# Patient Record
Sex: Female | Born: 1985 | Race: White | Hispanic: No | Marital: Married | Smoking: Current every day smoker
Health system: Southern US, Community
[De-identification: ages and names within clinical notes are randomized; demographics above are authoritative.]

## PROBLEM LIST (undated history)

## (undated) ENCOUNTER — Inpatient Hospital Stay: Payer: Self-pay

## (undated) DIAGNOSIS — Q318 Other congenital malformations of larynx: Secondary | ICD-10-CM

## (undated) DIAGNOSIS — J45909 Unspecified asthma, uncomplicated: Secondary | ICD-10-CM

## (undated) HISTORY — PX: OTHER SURGICAL HISTORY: SHX169

---

## 2010-08-07 ENCOUNTER — Observation Stay: Payer: Self-pay | Admitting: Obstetrics and Gynecology

## 2010-08-19 ENCOUNTER — Inpatient Hospital Stay: Payer: Self-pay | Admitting: Obstetrics and Gynecology

## 2010-10-01 ENCOUNTER — Observation Stay: Payer: Self-pay

## 2010-10-04 ENCOUNTER — Observation Stay: Payer: Self-pay | Admitting: Obstetrics and Gynecology

## 2010-10-19 ENCOUNTER — Inpatient Hospital Stay: Payer: Self-pay | Admitting: Obstetrics and Gynecology

## 2014-01-15 ENCOUNTER — Emergency Department: Payer: Self-pay | Admitting: Emergency Medicine

## 2014-01-22 ENCOUNTER — Emergency Department: Payer: Self-pay | Admitting: Emergency Medicine

## 2014-01-22 LAB — URINALYSIS, COMPLETE
Blood: NEGATIVE
Glucose,UR: NEGATIVE mg/dL (ref 0–75)
Nitrite: NEGATIVE
Ph: 5 (ref 4.5–8.0)
RBC,UR: 3 /HPF (ref 0–5)
Specific Gravity: 1.035 (ref 1.003–1.030)

## 2014-01-22 LAB — COMPREHENSIVE METABOLIC PANEL
ANION GAP: 8 (ref 7–16)
Albumin: 2.6 g/dL — ABNORMAL LOW (ref 3.4–5.0)
Alkaline Phosphatase: 70 U/L
BILIRUBIN TOTAL: 0.8 mg/dL (ref 0.2–1.0)
BUN: 19 mg/dL — ABNORMAL HIGH (ref 7–18)
CALCIUM: 7.6 mg/dL — AB (ref 8.5–10.1)
CHLORIDE: 93 mmol/L — AB (ref 98–107)
Co2: 30 mmol/L (ref 21–32)
Creatinine: 0.95 mg/dL (ref 0.60–1.30)
EGFR (African American): >60
EGFR (Non-African Amer.): >60
Glucose: 106 mg/dL — ABNORMAL HIGH (ref 65–99)
Osmolality: 265 (ref 275–301)
Potassium: 2.8 mmol/L — ABNORMAL LOW (ref 3.5–5.1)
SGOT(AST): 16 U/L (ref 15–37)
SGPT (ALT): 11 U/L — ABNORMAL LOW (ref 12–78)
Sodium: 131 mmol/L — ABNORMAL LOW (ref 136–145)
TOTAL PROTEIN: 6.4 g/dL (ref 6.4–8.2)

## 2014-01-22 LAB — CBC WITH DIFFERENTIAL/PLATELET
Basophil #: 0.1 10*3/uL (ref 0.0–0.1)
Basophil %: 0.3 %
EOS PCT: 0.4 %
Eosinophil #: 0.1 10*3/uL (ref 0.0–0.7)
HCT: 38 % (ref 35.0–47.0)
HGB: 12.8 g/dL (ref 12.0–16.0)
LYMPHS ABS: 1.5 10*3/uL (ref 1.0–3.6)
Lymphocyte %: 9.3 %
MCH: 31.7 pg (ref 26.0–34.0)
MCHC: 33.6 g/dL (ref 32.0–36.0)
MCV: 94 fL (ref 80–100)
Monocyte #: 1.3 x10 3/mm — ABNORMAL HIGH (ref 0.2–0.9)
Monocyte %: 8.6 %
NEUTROS PCT: 81.4 %
Neutrophil #: 12.7 10*3/uL — ABNORMAL HIGH (ref 1.4–6.5)
Platelet: 382 10*3/uL (ref 150–440)
RBC: 4.02 10*6/uL (ref 3.80–5.20)
RDW: 12.3 % (ref 11.5–14.5)
WBC: 15.6 10*3/uL — AB (ref 3.6–11.0)

## 2014-01-22 LAB — PREGNANCY, URINE: PREGNANCY TEST, URINE: NEGATIVE m[IU]/mL

## 2014-01-22 LAB — LIPASE, BLOOD: LIPASE: 145 U/L (ref 73–393)

## 2014-01-27 LAB — CULTURE, BLOOD (SINGLE)

## 2014-12-11 DIAGNOSIS — Z98891 History of uterine scar from previous surgery: Secondary | ICD-10-CM | POA: Insufficient documentation

## 2014-12-11 DIAGNOSIS — R6252 Short stature (child): Secondary | ICD-10-CM | POA: Insufficient documentation

## 2015-04-12 DIAGNOSIS — D509 Iron deficiency anemia, unspecified: Secondary | ICD-10-CM | POA: Insufficient documentation

## 2015-04-18 IMAGING — CR DG CHEST 2V
1 series · 2 of 2 positions shown · non-contrast
Comparison: 08/19/2010

CLINICAL DATA: Chest pain and short of breath

EXAM:
CHEST  2 VIEW

[Series 1: w chest pa · 0.14mm/px · 2 of 2 slices shown]
[im 1/2]
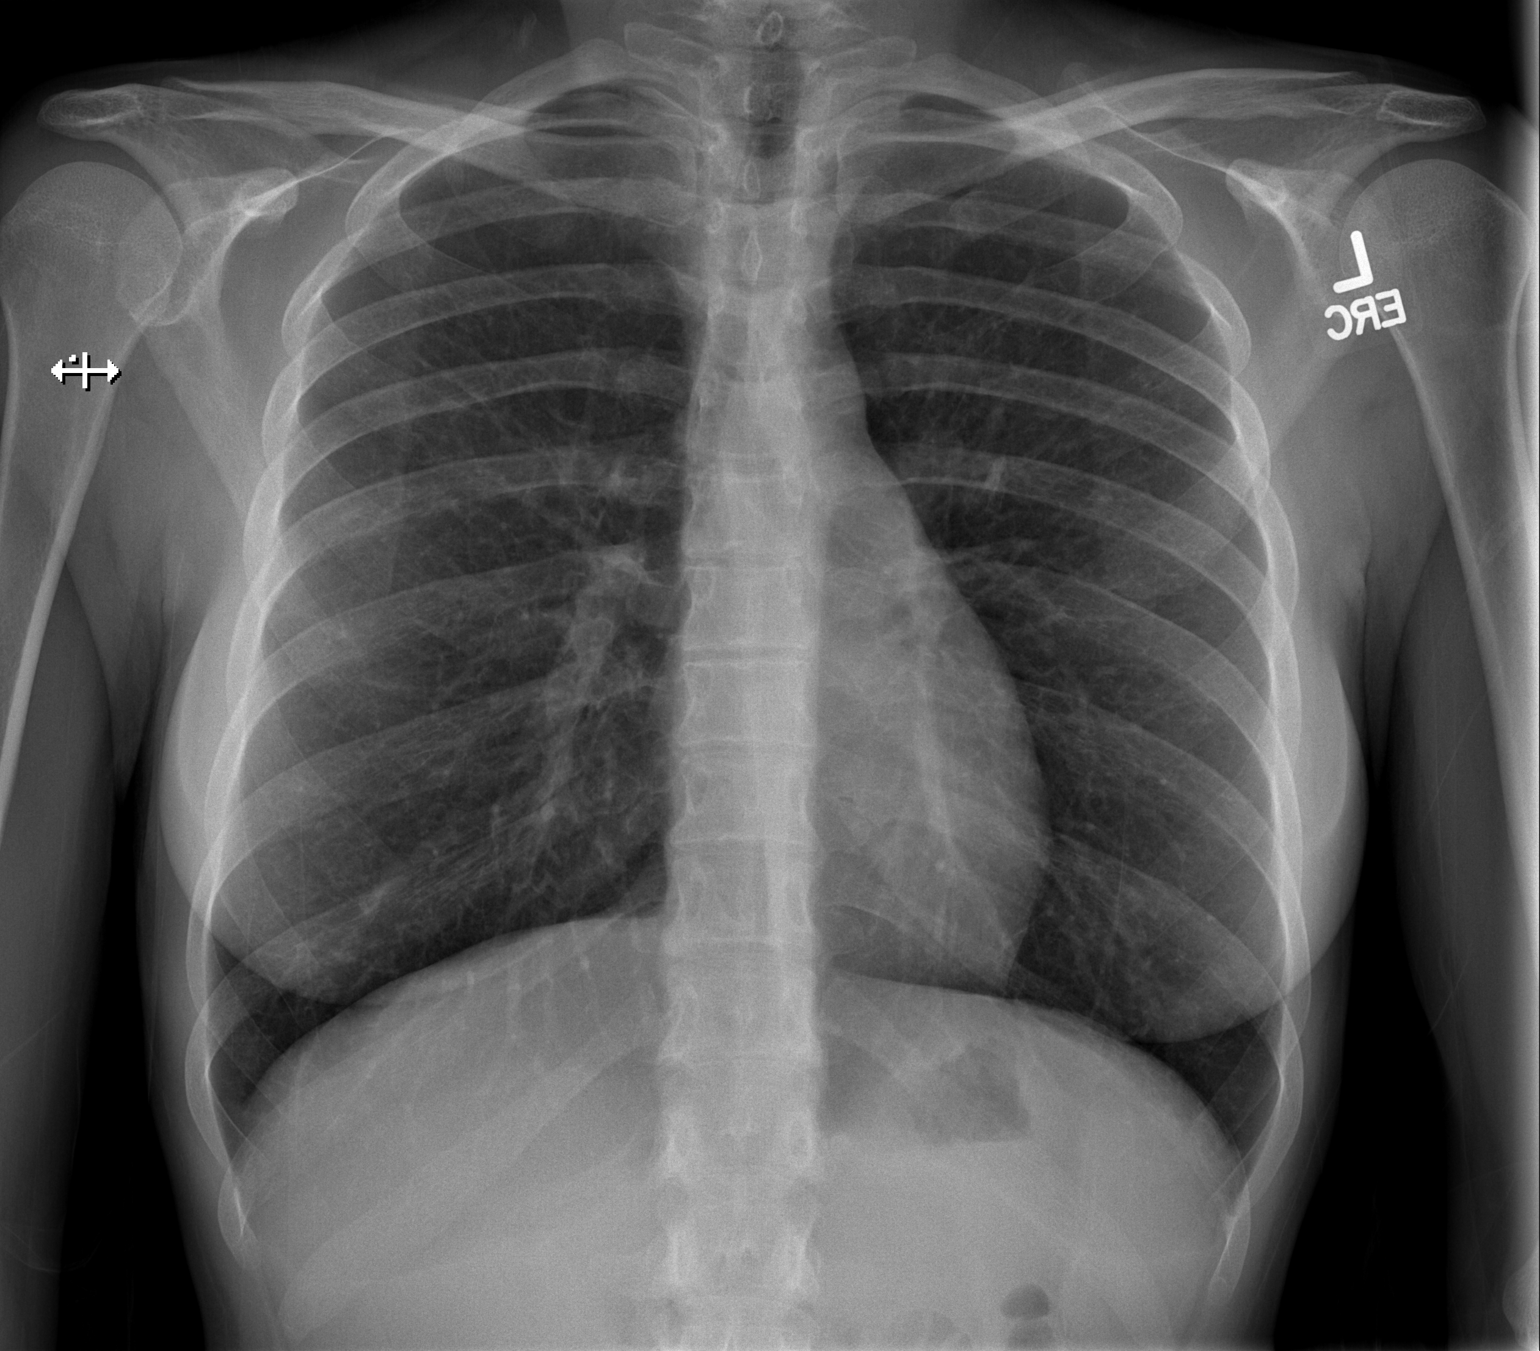
[im 2/2]
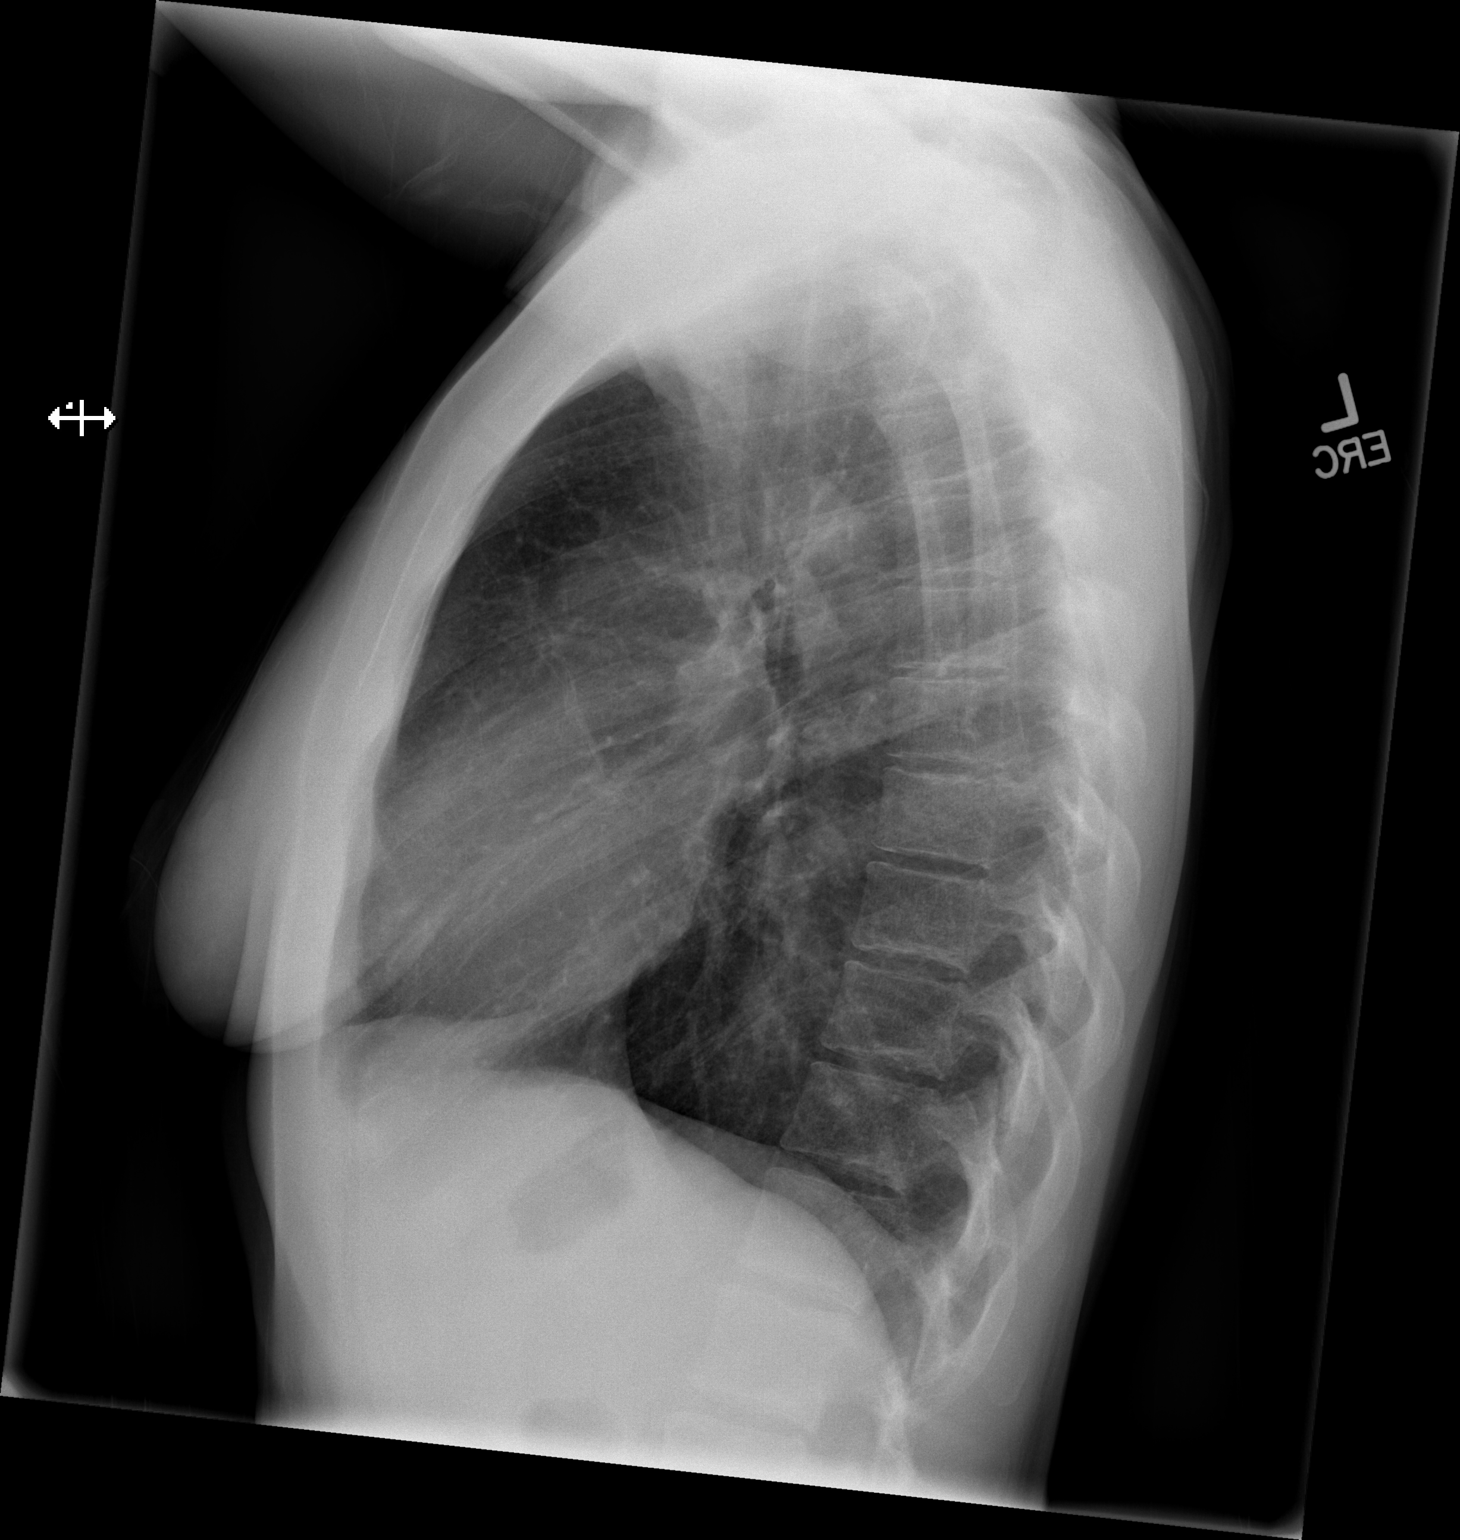

[2 of 2 positions shown; findings below may reference images not displayed]

FINDINGS: The heart size and mediastinal contours are within normal limits.
Both lungs are clear. The visualized skeletal structures are
unremarkable.
IMPRESSION: No active cardiopulmonary disease.

## 2015-04-18 IMAGING — CT CT MAXILLOFACIAL WITH CONTRAST
3 series · 16 of 47 positions shown, 19 images · IV contrast (agent unspecified)
Comparison: None.

CLINICAL DATA: Left facial swelling.  Recent  tooth extraction

EXAM:
CT MAXILLOFACIAL WITH CONTRAST
TECHNIQUE: Multidetector CT imaging of the maxillofacial structures was
performed with intravenous contrast. Multiplanar CT image
reconstructions were also generated. A small metallic BB was placed
on the right temple in order to reliably differentiate right from
left.
CONTRAST:  75 mL Ssovue-V00 IV

[Series 4: max soft 2 · axial · 0.34mm/px · z∈[-104,+16]mm · 10 of 70 slices shown, 13 images]
[im 5/70  brain]
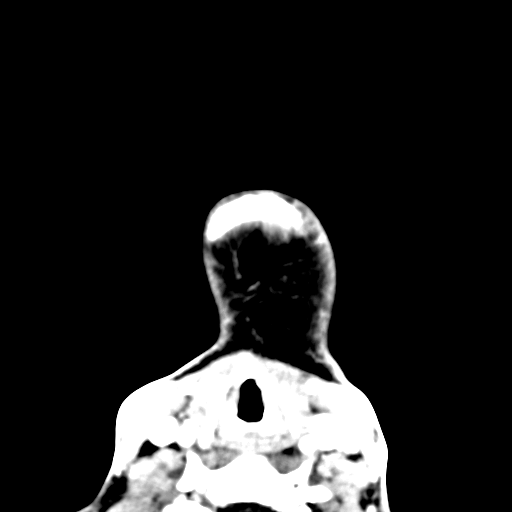
[im 5/70  bone]
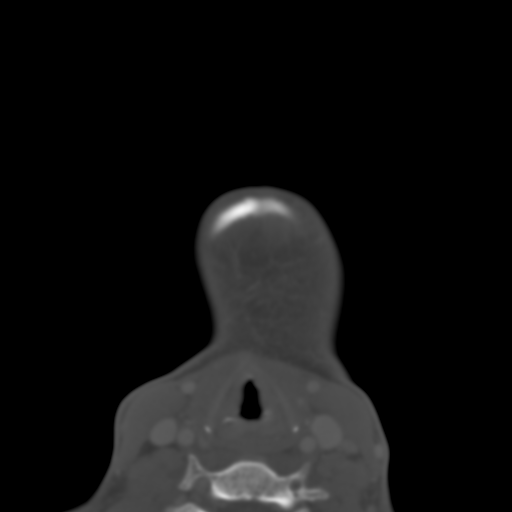
[im 12/70  bone]
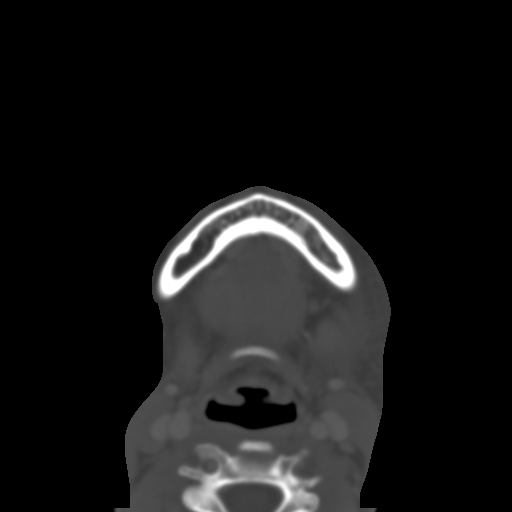
[im 20/70  bone]
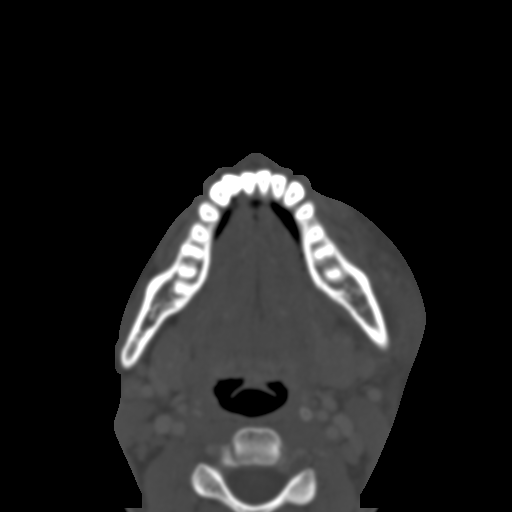
[im 24/70  bone]
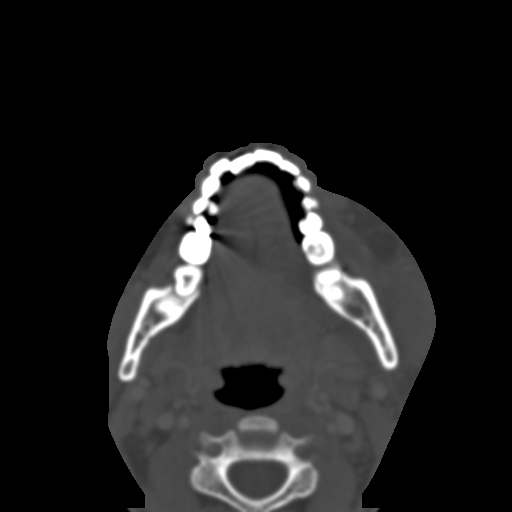
[im 31/70  brain]
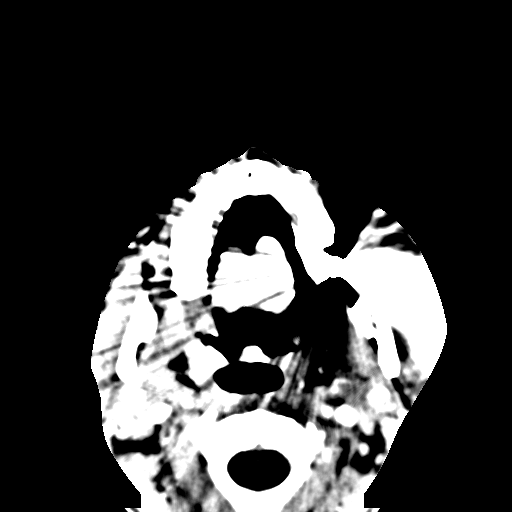
[im 31/70  bone]
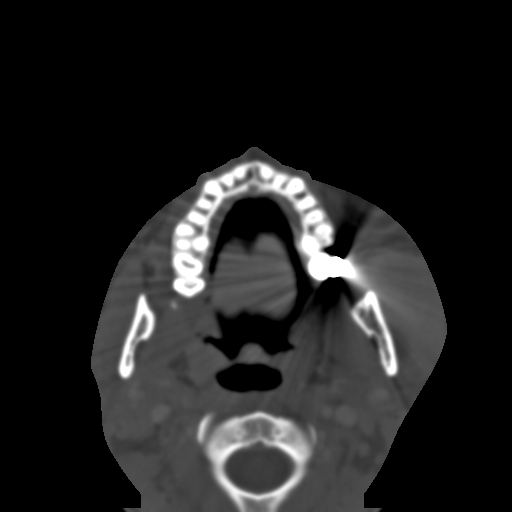
[im 39/70  bone]
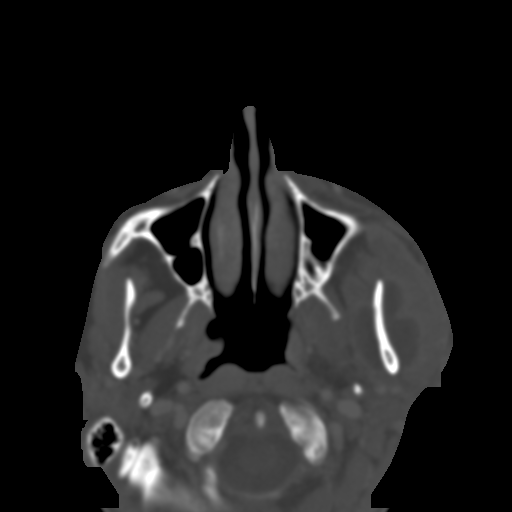
[im 46/70  bone]
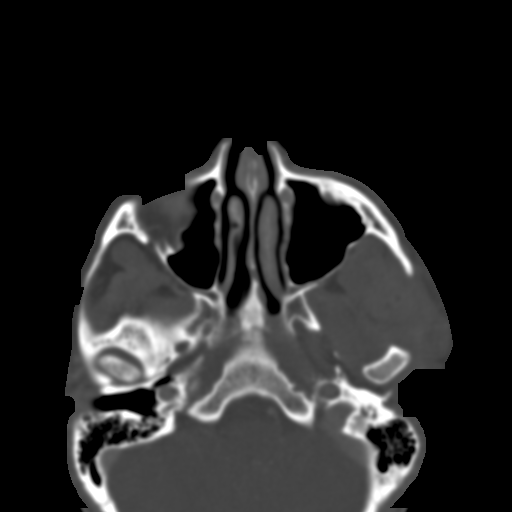
[im 53/70  bone]
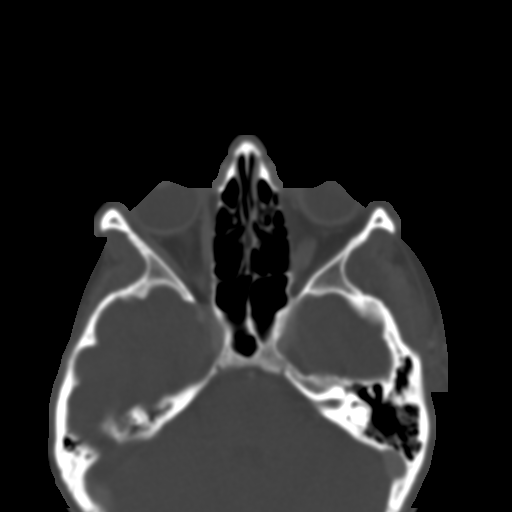
[im 58/70  brain]
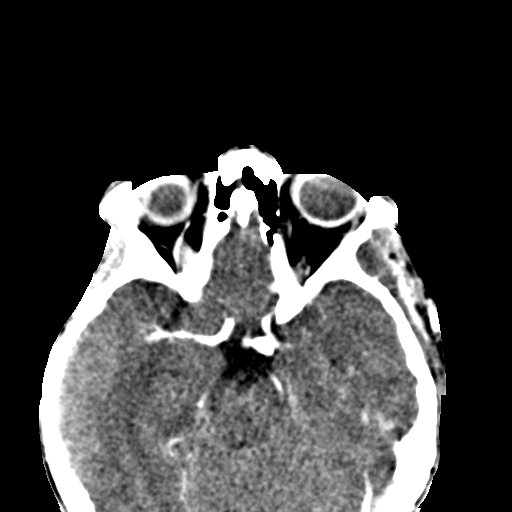
[im 58/70  bone]
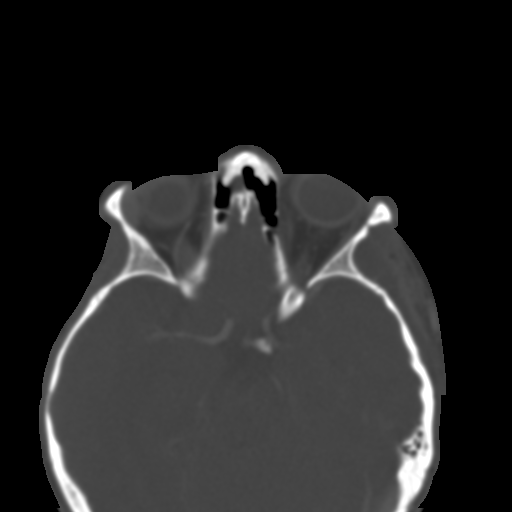
[im 65/70  bone]
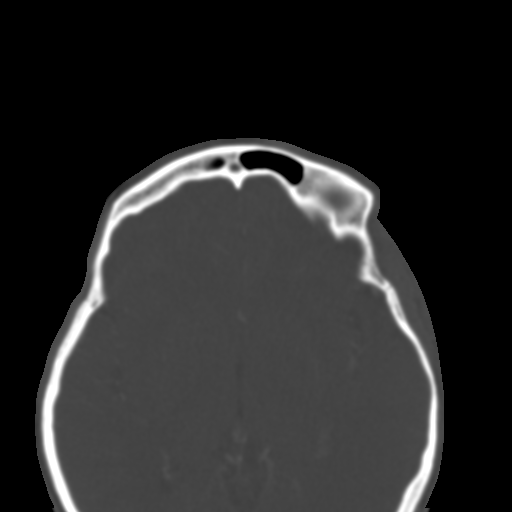

[Series 6: coronal soft · coronal · 0.29mm/px · 3 of 65 slices shown]
[im 22/65  bone]
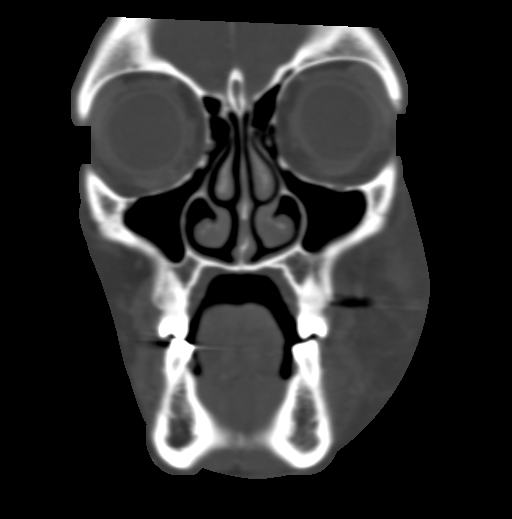
[im 29/65  bone]
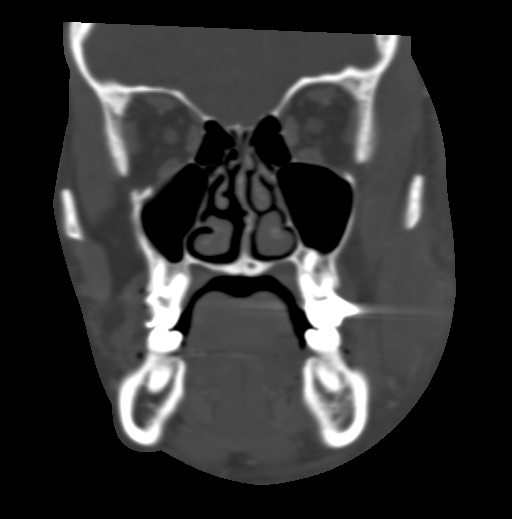
[im 36/65  bone]
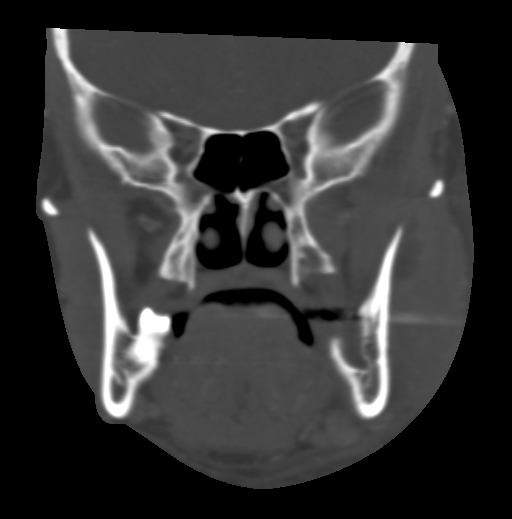

[Series 7: sagittal soft · sagittal · 0.29mm/px · 3 of 73 slices shown]
[im 25/73  bone]
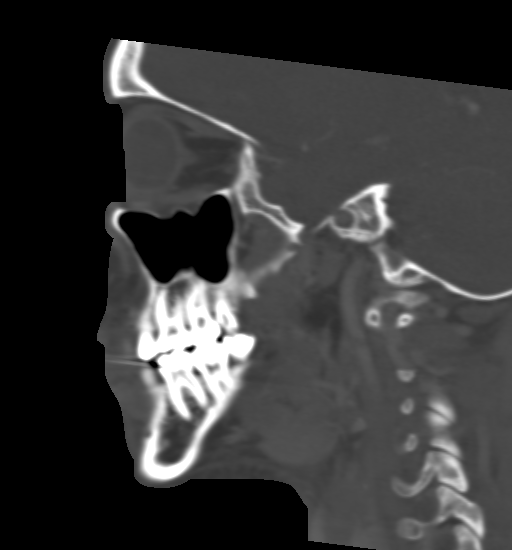
[im 37/73  bone]
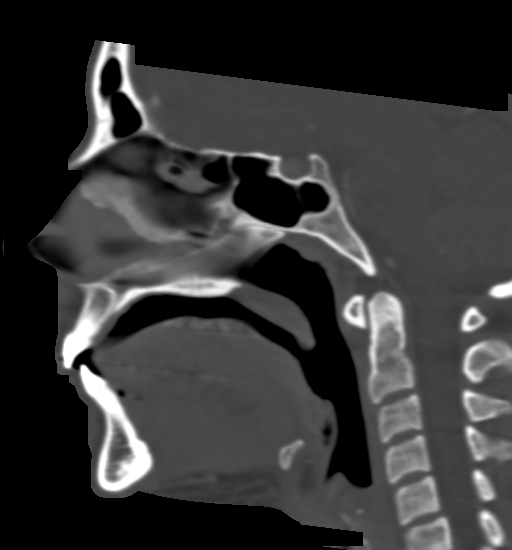
[im 49/73  bone]
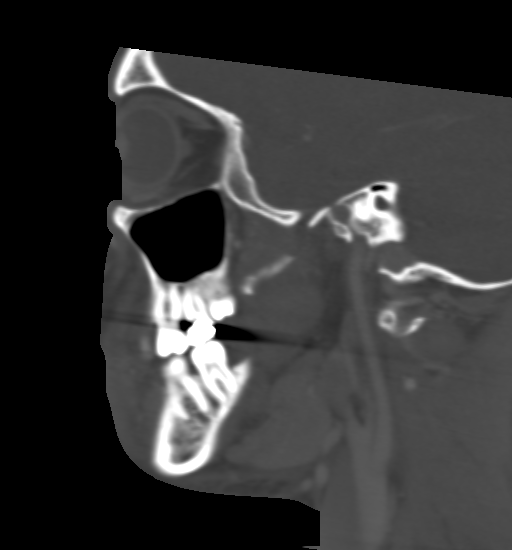

[16 of 47 positions shown; findings below may reference images not displayed]

FINDINGS: There is considerable swelling of the left face. There is an abscess
around the ramus of the mandible on the left. There is a rim
enhancing fluid collection medial and lateral to the mandibular
ramus on the left. The left masseter muscle is enlarged and inflamed
with increased enhancement compatible with myositis. There has been
recent extraction of the left lower third molar which may be the
source of infection. Multiple caries are present.

The airway is normal. Submandibular nodes are present on the left
due to infection.

The paranasal sinuses are clear.  Negative for fracture.
IMPRESSION: There is an abscess around the ramus of the mandible on the left.
This is both lateral and medial to the ramus. There is myositis of
the left masseter muscle. Recent extraction of left lower third
molar.

Multiple dental caries.

## 2015-04-23 ENCOUNTER — Observation Stay
Admission: EM | Admit: 2015-04-23 | Discharge: 2015-04-23 | Disposition: A | Discharge status: Home / Self Care | Payer: Medicaid Other | Attending: Obstetrics and Gynecology | Admitting: Obstetrics and Gynecology

## 2015-04-23 ENCOUNTER — Encounter: Payer: Self-pay | Admitting: *Deleted

## 2015-04-23 DIAGNOSIS — R109 Unspecified abdominal pain: Secondary | ICD-10-CM | POA: Diagnosis present

## 2015-04-23 DIAGNOSIS — R1084 Generalized abdominal pain: Secondary | ICD-10-CM

## 2015-04-23 DIAGNOSIS — N390 Urinary tract infection, site not specified: Secondary | ICD-10-CM

## 2015-04-23 DIAGNOSIS — O99333 Smoking (tobacco) complicating pregnancy, third trimester: Secondary | ICD-10-CM | POA: Insufficient documentation

## 2015-04-23 DIAGNOSIS — Z3A33 33 weeks gestation of pregnancy: Secondary | ICD-10-CM | POA: Insufficient documentation

## 2015-04-23 LAB — URINALYSIS COMPLETE WITH MICROSCOPIC (ARMC ONLY)
Bilirubin Urine: NEGATIVE
Glucose, UA: NEGATIVE mg/dL
Hgb urine dipstick: NEGATIVE
KETONES UR: NEGATIVE mg/dL
Leukocytes, UA: NEGATIVE
Nitrite: NEGATIVE
Protein, ur: NEGATIVE mg/dL
Specific Gravity, Urine: 1.008 (ref 1.005–1.030)
pH: 6 (ref 5.0–8.0)

## 2015-04-23 LAB — FETAL FIBRONECTIN: Fetal Fibronectin: NEGATIVE

## 2015-04-23 MED ORDER — TERBUTALINE SULFATE 1 MG/ML IJ SOLN
INTRAMUSCULAR | Status: AC
  Administered 2015-04-23: 0.25 mg via SUBCUTANEOUS
  Filled 2015-04-23: qty 1

## 2015-04-23 MED ORDER — TERBUTALINE SULFATE 1 MG/ML IJ SOLN
0.2500 mg | Freq: Once | INTRAMUSCULAR | Status: AC
  Administered 2015-04-23: 0.25 mg via SUBCUTANEOUS

## 2015-04-23 MED ORDER — NITROFURANTOIN MONOHYD MACRO 100 MG PO CAPS
100.0000 mg | ORAL_CAPSULE | Freq: Two times a day (BID) | ORAL | Status: DC
  Filled 2015-04-23: qty 1
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
## 2015-04-23 NOTE — Discharge Summary (Signed)
Patient discharged home, discharge instructions reviewed, prescriptions given, patient states understanding. patient left via wheelchair by RN, denies any other needs at this time

## 2015-04-23 NOTE — Discharge Instructions (Signed)
Drink a lot of fluids and get plenty of rest. Per midwife no work tomorrow 04/24/15

## 2015-04-23 NOTE — Progress Notes (Addendum)
Patient ID: Katherine Bentley, female   DOB: 1986/05/23, 29 y.o.   MRN: [id number]  NST reactive with 2 accels 15 x 15 BPM with TH PTL. Urine has many bacteria so will tx with Macrobid

## 2015-04-23 NOTE — OB Triage Note (Signed)
Patient complains of feeling sick Friday and Saturday with vomiting. Pt stes she has been able to tolerate food and liquids since. Pt also states that her urine has been really "yellow" and she has had some yellowish mucus like  discharge.
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
## 2015-05-31 ENCOUNTER — Encounter
Admission: RE | Admit: 2015-05-31 | Discharge: 2015-05-31 | Disposition: A | Discharge status: Home / Self Care | Payer: Medicaid Other | Source: Ambulatory Visit | Attending: Obstetrics and Gynecology | Admitting: Obstetrics and Gynecology

## 2015-05-31 HISTORY — DX: Other congenital malformations of larynx: Q31.8

## 2015-05-31 LAB — BASIC METABOLIC PANEL
ANION GAP: 8 (ref 5–15)
BUN: 7 mg/dL (ref 6–20)
CO2: 22 mmol/L (ref 22–32)
Calcium: 8.6 mg/dL — ABNORMAL LOW (ref 8.9–10.3)
Chloride: 103 mmol/L (ref 101–111)
Creatinine, Ser: 0.64 mg/dL (ref 0.44–1.00)
GFR calc Af Amer: >60 mL/min (ref >60)
GFR calc non Af Amer: >60 mL/min (ref >60)
Glucose, Bld: 82 mg/dL (ref 65–99)
Potassium: 3.4 mmol/L — ABNORMAL LOW (ref 3.5–5.1)
Sodium: 133 mmol/L — ABNORMAL LOW (ref 135–145)

## 2015-05-31 LAB — TYPE AND SCREEN
ABO/RH(D): A POS
Antibody Screen: NEGATIVE

## 2015-05-31 LAB — CBC
HEMATOCRIT: 35.2 % (ref 35.0–47.0)
HEMOGLOBIN: 11.9 g/dL — AB (ref 12.0–16.0)
MCH: 31.7 pg (ref 26.0–34.0)
MCHC: 33.8 g/dL (ref 32.0–36.0)
MCV: 93.7 fL (ref 80.0–100.0)
Platelets: 306 10*3/uL (ref 150–440)
RBC: 3.76 MIL/uL — ABNORMAL LOW (ref 3.80–5.20)
RDW: 12.5 % (ref 11.5–14.5)
WBC: 10.3 10*3/uL (ref 3.6–11.0)

## 2015-05-31 LAB — ABO/RH: ABO/RH(D): A POS

## 2015-05-31 NOTE — Progress Notes (Signed)
             Katherine Bentley   Your procedure is scheduled on 7/22   Report to [facility] at 5:30             [facility]             [address]             [place], [place] [address]  Call this number if you have problems the morning of surgery: [phone]   Remember:   Do not eat food or drink liquids after midnight.     Do not wear jewelry, make-up or nail polish.  Do not wear lotions, powders, or perfumes. You may wear deodorant.  Do not shave prior to surgery.  Do not bring valuables to the hospital.  [facility] is not responsible for any belongings or valuables.                Contacts, dentures or bridgework may not be worn into surgery.  Leave suitcase in the car. After surgery it may be brought to your room.  For patients admitted to the hospital, discharge time is determined by your             treatment team.               Special Instructions: Preparing the skin before Cesarean Section              To help prevent the risk of infection at your surgical site, we are providing             you with rinse-free Sage 2% Chlorhexidine Gluconate (HCG) disposable             wipes.               The night before surgery:              1. Shower or bathe with warm water             2. Do not apply lotion or perfume             3. Wait one hour after shower, skin should be dry and cool             4. Open Sage wipe package - 2 disposable cloths are inside             5. Wipe the lower abdomen from the pubic line to the navel and hip bone to hip             bone with one cloth             6. Use the second cloth to wipe the front of the upper thighs             7. Allow the area to dry for one minute. DO NOT RINSE             8. Skin may feel "tacky" for several minutes             9. Dress in freshly laundered, clean clothes           10. Do not shower the morning of surgery    Please read over the following fact sheets that you were  given: Coughing and            Deep Breathing and Surgical Site Infection Prevention

## 2015-06-01 ENCOUNTER — Encounter: Payer: Self-pay | Admitting: Anesthesiology

## 2015-06-01 ENCOUNTER — Inpatient Hospital Stay
Admission: RE | Admit: 2015-06-01 | Discharge: 2015-06-04 | DRG: 765 | Disposition: A | Discharge status: Home / Self Care | Payer: Medicaid Other | Source: Ambulatory Visit | Attending: Obstetrics and Gynecology | Admitting: Obstetrics and Gynecology

## 2015-06-01 ENCOUNTER — Encounter
Admission: RE | Disposition: A | Discharge status: Home / Self Care | Payer: Self-pay | Source: Ambulatory Visit | Attending: Obstetrics and Gynecology

## 2015-06-01 ENCOUNTER — Inpatient Hospital Stay: Payer: Medicaid Other | Admitting: Anesthesiology

## 2015-06-01 DIAGNOSIS — O99334 Smoking (tobacco) complicating childbirth: Secondary | ICD-10-CM | POA: Diagnosis present

## 2015-06-01 DIAGNOSIS — Z302 Encounter for sterilization: Secondary | ICD-10-CM | POA: Diagnosis not present

## 2015-06-01 DIAGNOSIS — Z3A39 39 weeks gestation of pregnancy: Secondary | ICD-10-CM | POA: Diagnosis present

## 2015-06-01 DIAGNOSIS — F17210 Nicotine dependence, cigarettes, uncomplicated: Secondary | ICD-10-CM | POA: Diagnosis present

## 2015-06-01 DIAGNOSIS — Z9889 Other specified postprocedural states: Secondary | ICD-10-CM

## 2015-06-01 DIAGNOSIS — D62 Acute posthemorrhagic anemia: Secondary | ICD-10-CM | POA: Diagnosis not present

## 2015-06-01 DIAGNOSIS — O3421 Maternal care for scar from previous cesarean delivery: Principal | ICD-10-CM | POA: Diagnosis present

## 2015-06-01 SURGERY — Surgical Case
Anesthesia: Spinal | Laterality: Bilateral | Wound class: Clean Contaminated

## 2015-06-01 MED ORDER — MENTHOL 3 MG MT LOZG: 1.0000 | LOZENGE | OROMUCOSAL | Status: DC | PRN

## 2015-06-01 MED ORDER — DIPHENHYDRAMINE HCL 25 MG PO CAPS
25.0000 mg | ORAL_CAPSULE | ORAL | Status: DC | PRN
Start: 2015-06-01 — End: 2015-06-01

## 2015-06-01 MED ORDER — NALOXONE HCL 0.4 MG/ML IJ SOLN: 0.4000 mg | INTRAMUSCULAR | Status: DC | PRN

## 2015-06-01 MED ORDER — ONDANSETRON HCL 4 MG/2ML IJ SOLN
4.0000 mg | Freq: Once | INTRAMUSCULAR | Status: AC | PRN
  Administered 2015-06-01: 4 mg via INTRAVENOUS

## 2015-06-01 MED ORDER — ZOLPIDEM TARTRATE 5 MG PO TABS: 5.0000 mg | ORAL_TABLET | Freq: Every evening | ORAL | Status: DC | PRN

## 2015-06-01 MED ORDER — NALBUPHINE HCL 10 MG/ML IJ SOLN
5.0000 mg | INTRAMUSCULAR | Status: DC | PRN
  Filled 2015-06-01: qty 0.5

## 2015-06-01 MED ORDER — SCOPOLAMINE 1 MG/3DAYS TD PT72
1.0000 | MEDICATED_PATCH | Freq: Once | TRANSDERMAL | Status: AC
  Administered 2015-06-01: 1.5 mg via TRANSDERMAL
  Filled 2015-06-01: qty 1

## 2015-06-01 MED ORDER — MEPERIDINE HCL 25 MG/ML IJ SOLN: 6.2500 mg | INTRAMUSCULAR | Status: DC | PRN

## 2015-06-01 MED ORDER — KETOROLAC TROMETHAMINE 30 MG/ML IJ SOLN: 30.0000 mg | Freq: Four times a day (QID) | INTRAMUSCULAR | Status: DC | PRN

## 2015-06-01 MED ORDER — TETANUS-DIPHTH-ACELL PERTUSSIS 5-2.5-18.5 LF-MCG/0.5 IM SUSP: 0.5000 mL | Freq: Once | INTRAMUSCULAR | Status: DC

## 2015-06-01 MED ORDER — SODIUM CHLORIDE 0.9 % IV SOLN
INTRAVENOUS | Status: AC
  Administered 2015-06-01: 08:00:00
  Filled 2015-06-01: qty 2000

## 2015-06-01 MED ORDER — MEASLES, MUMPS & RUBELLA VAC ~~LOC~~ INJ
0.5000 mL | INJECTION | Freq: Once | SUBCUTANEOUS | Status: AC
  Administered 2015-06-04: 0.5 mL via SUBCUTANEOUS
  Filled 2015-06-01: qty 0.5

## 2015-06-01 MED ORDER — LANOLIN HYDROUS EX OINT: 1.0000 "application " | TOPICAL_OINTMENT | CUTANEOUS | Status: DC | PRN

## 2015-06-01 MED ORDER — MORPHINE SULFATE (PF) 0.5 MG/ML IJ SOLN
INTRAMUSCULAR | Status: DC | PRN
  Administered 2015-06-01: .15 mg via EPIDURAL

## 2015-06-01 MED ORDER — KETOROLAC TROMETHAMINE 30 MG/ML IJ SOLN
30.0000 mg | Freq: Four times a day (QID) | INTRAMUSCULAR | Status: AC | PRN
  Administered 2015-06-01 – 2015-06-02 (×3): 30 mg via INTRAVENOUS
  Filled 2015-06-01 (×3): qty 1

## 2015-06-01 MED ORDER — SIMETHICONE 80 MG PO CHEW
80.0000 mg | CHEWABLE_TABLET | ORAL | Status: DC
  Administered 2015-06-02: 80 mg via ORAL
  Filled 2015-06-01: qty 1

## 2015-06-01 MED ORDER — DIBUCAINE 1 % RE OINT: 1.0000 "application " | TOPICAL_OINTMENT | RECTAL | Status: DC | PRN

## 2015-06-01 MED ORDER — DIPHENHYDRAMINE HCL 25 MG PO CAPS: 25.0000 mg | ORAL_CAPSULE | Freq: Four times a day (QID) | ORAL | Status: DC | PRN

## 2015-06-01 MED ORDER — PHENYLEPHRINE HCL 10 MG/ML IJ SOLN
INTRAMUSCULAR | Status: DC | PRN
  Administered 2015-06-01: 100 ug via INTRAVENOUS

## 2015-06-01 MED ORDER — LACTATED RINGERS IV SOLN
INTRAVENOUS | Status: DC
  Administered 2015-06-01 (×2): via INTRAVENOUS

## 2015-06-01 MED ORDER — NALBUPHINE HCL 10 MG/ML IJ SOLN
5.0000 mg | Freq: Once | INTRAMUSCULAR | Status: AC | PRN
  Filled 2015-06-01: qty 0.5

## 2015-06-01 MED ORDER — ONDANSETRON HCL 4 MG/2ML IJ SOLN
INTRAMUSCULAR | Status: DC | PRN
  Administered 2015-06-01: 4 mg via INTRAVENOUS

## 2015-06-01 MED ORDER — OXYTOCIN 40 UNITS IN LACTATED RINGERS INFUSION - SIMPLE MED: 62.5000 mL/h | INTRAVENOUS | Status: AC

## 2015-06-01 MED ORDER — FENTANYL CITRATE (PF) 100 MCG/2ML IJ SOLN
25.0000 ug | INTRAMUSCULAR | Status: DC | PRN
  Administered 2015-06-01 (×2): 25 ug via INTRAVENOUS
  Administered 2015-06-01: 12.5 ug via INTRAVENOUS
  Administered 2015-06-01: 25 ug via INTRAVENOUS
  Filled 2015-06-01: qty 2

## 2015-06-01 MED ORDER — MEPERIDINE HCL 50 MG PO TABS
50.0000 mg | ORAL_TABLET | ORAL | Status: DC | PRN
Start: 2015-06-01 — End: 2015-06-04

## 2015-06-01 MED ORDER — NALOXONE HCL 1 MG/ML IJ SOLN
1.0000 ug/kg/h | INTRAVENOUS | Status: DC | PRN
  Filled 2015-06-01: qty 2

## 2015-06-01 MED ORDER — IBUPROFEN 600 MG PO TABS
600.0000 mg | ORAL_TABLET | Freq: Four times a day (QID) | ORAL | Status: DC
  Administered 2015-06-02 – 2015-06-04 (×7): 600 mg via ORAL
  Filled 2015-06-01 (×7): qty 1

## 2015-06-01 MED ORDER — LACTATED RINGERS IV SOLN: Freq: Once | INTRAVENOUS | Status: DC

## 2015-06-01 MED ORDER — OXYTOCIN 40 UNITS IN LACTATED RINGERS INFUSION - SIMPLE MED
INTRAVENOUS | Status: AC
  Administered 2015-06-01: 300 mL via INTRAVENOUS
  Filled 2015-06-01: qty 1000

## 2015-06-01 MED ORDER — SENNOSIDES-DOCUSATE SODIUM 8.6-50 MG PO TABS
2.0000 | ORAL_TABLET | ORAL | Status: DC
  Administered 2015-06-02 – 2015-06-04 (×3): 2 via ORAL
  Filled 2015-06-01 (×3): qty 2

## 2015-06-01 MED ORDER — WITCH HAZEL-GLYCERIN EX PADS: 1.0000 "application " | MEDICATED_PAD | CUTANEOUS | Status: DC | PRN

## 2015-06-01 MED ORDER — DIPHENHYDRAMINE HCL 50 MG/ML IJ SOLN
12.5000 mg | INTRAMUSCULAR | Status: DC | PRN
  Administered 2015-06-01 – 2015-06-02 (×2): 12.5 mg via INTRAVENOUS
  Filled 2015-06-01 (×2): qty 1

## 2015-06-01 MED ORDER — PRENATAL MULTIVITAMIN CH
1.0000 | ORAL_TABLET | Freq: Every day | ORAL | Status: DC
  Administered 2015-06-02 – 2015-06-03 (×2): 1 via ORAL
  Filled 2015-06-01 (×2): qty 1

## 2015-06-01 MED ORDER — ACETAMINOPHEN 325 MG PO TABS: 650.0000 mg | ORAL_TABLET | ORAL | Status: DC | PRN

## 2015-06-01 MED ORDER — EPHEDRINE SULFATE 50 MG/ML IJ SOLN
INTRAMUSCULAR | Status: DC | PRN
  Administered 2015-06-01 (×3): 5 mg via INTRAVENOUS

## 2015-06-01 MED ORDER — OXYTOCIN 40 UNITS IN LACTATED RINGERS INFUSION - SIMPLE MED
INTRAVENOUS | Status: AC
  Administered 2015-06-01: 40 [IU]
  Filled 2015-06-01: qty 1000

## 2015-06-01 MED ORDER — LACTATED RINGERS IV SOLN: INTRAVENOUS | Status: DC

## 2015-06-01 MED ORDER — SIMETHICONE 80 MG PO CHEW
80.0000 mg | CHEWABLE_TABLET | Freq: Three times a day (TID) | ORAL | Status: DC
  Administered 2015-06-03 – 2015-06-04 (×3): 80 mg via ORAL
  Filled 2015-06-01 (×3): qty 1

## 2015-06-01 MED ORDER — LACTATED RINGERS IV SOLN
INTRAVENOUS | Status: DC
  Administered 2015-06-01 – 2015-06-02 (×5): via INTRAVENOUS

## 2015-06-01 MED ORDER — PROMETHAZINE HCL 25 MG/ML IJ SOLN: 25.0000 mg | INTRAMUSCULAR | Status: DC | PRN

## 2015-06-01 MED ORDER — SODIUM CHLORIDE 0.9 % IJ SOLN: 3.0000 mL | INTRAMUSCULAR | Status: DC | PRN

## 2015-06-01 MED ORDER — CEFAZOLIN SODIUM-DEXTROSE 2-3 GM-% IV SOLR: 2.0000 g | INTRAVENOUS | Status: DC

## 2015-06-01 MED ORDER — CITRIC ACID-SODIUM CITRATE 334-500 MG/5ML PO SOLN
30.0000 mL | Freq: Once | ORAL | Status: AC
  Administered 2015-06-01: 30 mL via ORAL
  Filled 2015-06-01 (×2): qty 30

## 2015-06-01 MED ORDER — ONDANSETRON HCL 4 MG/2ML IJ SOLN
4.0000 mg | Freq: Three times a day (TID) | INTRAMUSCULAR | Status: DC | PRN
  Administered 2015-06-01 (×2): 4 mg via INTRAVENOUS
  Filled 2015-06-01 (×3): qty 2

## 2015-06-01 MED ORDER — SIMETHICONE 80 MG PO CHEW
80.0000 mg | CHEWABLE_TABLET | ORAL | Status: DC | PRN
  Administered 2015-06-02 – 2015-06-03 (×5): 80 mg via ORAL
  Filled 2015-06-01 (×5): qty 1

## 2015-06-01 SURGICAL SUPPLY — 21 items
BARRIER ADHS 3X4 INTERCEED (GAUZE/BANDAGES/DRESSINGS) ×3 IMPLANT
CANISTER SUCT 3000ML (MISCELLANEOUS) ×3 IMPLANT
CATH KIT ON-Q SILVERSOAK 5IN (CATHETERS) ×6 IMPLANT
CHLORAPREP W/TINT 26ML (MISCELLANEOUS) ×3 IMPLANT
DRSG TELFA 3X8 NADH (GAUZE/BANDAGES/DRESSINGS) ×3 IMPLANT
ELECT CAUTERY BLADE 6.4 (BLADE) ×3 IMPLANT
GAUZE SPONGE 4X4 12PLY STRL (GAUZE/BANDAGES/DRESSINGS) ×3 IMPLANT
GLOVE BIO SURGEON STRL SZ8 (GLOVE) ×3 IMPLANT
GOWN STRL REUS W/ TWL LRG LVL3 (GOWN DISPOSABLE) ×2 IMPLANT
GOWN STRL REUS W/ TWL XL LVL3 (GOWN DISPOSABLE) ×1 IMPLANT
GOWN STRL REUS W/TWL LRG LVL3 (GOWN DISPOSABLE) ×4
GOWN STRL REUS W/TWL XL LVL3 (GOWN DISPOSABLE) ×2
NS IRRIG 1000ML POUR BTL (IV SOLUTION) ×3 IMPLANT
PACK C SECTION AR (MISCELLANEOUS) ×3 IMPLANT
PAD GROUND ADULT SPLIT (MISCELLANEOUS) ×3 IMPLANT
PAD OB MATERNITY 4.3X12.25 (PERSONAL CARE ITEMS) ×3 IMPLANT
PAD PREP 24X41 OB/GYN DISP (PERSONAL CARE ITEMS) ×3 IMPLANT
STRAP SAFETY BODY (MISCELLANEOUS) ×3 IMPLANT
SUT CHROMIC 1 CTX 36 (SUTURE) ×9 IMPLANT
SUT PLAIN GUT 0 (SUTURE) ×6 IMPLANT
SUT VIC AB 0 CT1 36 (SUTURE) ×6 IMPLANT

## 2015-06-01 NOTE — Anesthesia Procedure Notes (Signed)
Spinal  Additional Notes 12.5 mg marcaine and 0.15 astromorph intrathecally.

## 2015-06-01 NOTE — Op Note (Signed)
NAMEMarland Kitchen  SION, THANE NO.:  [id number]  MEDICAL RECORD NO.:  [id number]  LOCATION:  LDR6                         FACILITY:  [facility]  PHYSICIAN:  Jennell Corner, MDDATE OF BIRTH:  1986/08/22  DATE OF PROCEDURE: DATE OF DISCHARGE:                              OPERATIVE REPORT   PREOPERATIVE DIAGNOSIS: 1. A 30 +1 week estimated gestational age. 2. Elective repeat cesarean section. 3. Elective permanent sterilization.  POSTOPERATIVE DIAGNOSIS: 1. A 30 +1 week estimated gestational age. 2. Elective repeat cesarean section. 3. Elective permanent sterilization.  PROCEDURE: 1. Elective repeat low transverse cesarean section. 2. Bilateral tubal ligation, [place].  ANESTHESIA:  Spinal.  SURGEON:  Jennell Corner, M.D.  INDICATIONS:  A 29 year old gravida 2, para 1 patient at 30 +1 weeks, who has elected for repeat cesarean section and elective permanent sterilization.  The patient has reconfirmed the desire for elective permanent sterilization and she is aware of the failure rate of tubal ligation of  1/200 per year.  DESCRIPTION OF PROCEDURE:  After adequate spinal anesthesia, the patient was placed in dorsal supine position.  The hip roll was placed on to the patient's right side.  The patient's abdomen was prepped and draped in normal sterile fashion.  Time-out was performed.  The patient did receive 2 g IV Ancef prior to commencement of the case.  A Pfannenstiel incision was made 2 fingerbreadths above the symphysis pubis.  Sharp dissection was used to identify the fascia.  Fascia was opened in the midline and opened in a transverse fashion.  The superior aspect of the fascia was grasped with Kocher clamps and the recti muscles dissected free.  The inferior aspect of the fascia was grasped with Kocher clamps, pyramidalis muscles dissected free.  Peritoneal cavity was opened sharply.  There were several dense uterine adhesions to the  anterior abdominal wall.  These were clamped, transected, and suture ligated with 0 Vicryl suture.  The vesicular uterine peritoneal fold was identified and a bladder flap was created, then bladder was reflected inferiorly. A low transverse uterine incision was made.  Upon entry into the endometrial cavity,  clear fluid was resulted.  The excision was extended with blunt transverse traction.  Fetal head was brought to the incision.  Vacuum was applied to the infant's occiput.  With one gentle pull, the head was delivered and the vacuum was removed.  A loose nuchal cord was identified and reduced.  Shoulders and body delivered without difficulty,  vigorous female.  Cord was doubly clamped and passed to the nursery staff who assigned Apgar scores of 8 and 9.  Weight 2850 g. Placenta was manually delivered and the uterus was exteriorized and wiped clean with laparotomy tape.  Ring forceps was used to open the cervix and this was passed off the operative field.  The uterine incision was closed with 1 chromic suture in a running, locking fashion. Good approximation of the edges.  Good hemostasis was noted.  Fallopian tubes and ovaries appeared normal.  Posterior cul-de-sac was irrigated and suctioned.  Attention was directed to the patient's right fallopian tube, which was grasped at the midportion with a Babcock clamp.  Two separate 0 plain gut sutures were applied and a 1.5  cm portion of fallopian tube was removed good hemostasis was noted.  Similar procedure was repeated on the patient's left fallopian tube, which was grasped also in the midportion after applying 2 separate 0 plain gut sutures. The 1.5 cm portion of fallopian tube was removed with good hemostasis. Uterus was placed back into the abdominal cavity.  The pericolic gutters were wiped clean with laparotomy tape.  2-0 ligation sites appeared hemostatic.  The uterine incision appeared hemostatic.  Interceed was placed over the  uterine incision in a T-shaped fashion.  The fascia was closed with 0 Vicryl suture in a running, nonlocking fashion.  Good approximation of the edges.  Good hemostasis was noted.  Subcutaneous tissues were irrigated and bovied for hemostasis and the skin was reapproximated with staples.  Intraoperative fluids 1400 mL, EBL 500 mL, urine output 100 mL.  There were no complications.  The patient tolerated the procedure well, was taken recovery room in good condition.          ______________________________ Jennell Corner, MD     TS/MEDQ  D:  06/01/2015  T:  06/01/2015  Job:  [id number]

## 2015-06-01 NOTE — Progress Notes (Signed)
Patient ID: Katherine Bentley, female   DOB: 1986-02-09, 29 y.o.   MRN: [id number] Pt is ready for repeat LTCS and elective BTL All questions answered >. Ready to proceed with surgery

## 2015-06-01 NOTE — Anesthesia Postprocedure Evaluation (Signed)
  Anesthesia Post-op Note  Patient: Katherine Bentley  Procedure(s) Performed: Procedure(s): CESAREAN SECTION WITH BILATERAL TUBAL LIGATION (Bilateral)  Anesthesia type:Spinal  Patient location: PACU  Post pain: Pain level controlled  Post assessment: Post-op Vital signs reviewed, Patient's Cardiovascular Status Stable, Respiratory Function Stable, Patent Airway and No signs of Nausea or vomiting  Post vital signs: Reviewed and stable  Last Vitals:  Filed Vitals:   06/01/15 0913  BP: 117/54  Pulse: 77  Temp:   Resp:     Level of consciousness: awake, alert  and patient cooperative  Complications: No apparent anesthesia complications

## 2015-06-01 NOTE — Transfer of Care (Signed)
Immediate Anesthesia Transfer of Care Note  Patient: Katherine Bentley  Procedure(s) Performed: Procedure(s): CESAREAN SECTION WITH BILATERAL TUBAL LIGATION (Bilateral)  Patient Location: PACU  Anesthesia Type:Spinal  Level of Consciousness: awake, alert , oriented and patient cooperative  Airway & Oxygen Therapy: Patient Spontanous Breathing  Post-op Assessment: Report given to RN, Post -op Vital signs reviewed and stable and Patient moving all extremities X 4  Post vital signs: Reviewed and stable  Last Vitals:  Filed Vitals:   06/01/15 0848  BP: 111/54  Pulse: 64  Temp: 36.3 C  Resp: 20    Complications: No apparent anesthesia complications
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
## 2015-06-01 NOTE — Brief Op Note (Signed)
06/01/2015  8:27 AM  PATIENT:  Suzan Slick Podesta  29 y.o. female  PRE-OPERATIVE DIAGNOSIS:  REPEAT CESAREAN SECTION AND DESIRES PERMANENT STERILIZATION  POST-OPERATIVE DIAGNOSIS:  REPEAT CESAREAN SECTION AND DESIRES PERMANENT STERILIZATION  PROCEDURE:  Procedure(s): CESAREAN SECTION WITH BILATERAL TUBAL LIGATION (Bilateral)  SURGEON:  Surgeon(s) and Role:    Suzy Bouchard, MD - Primary  PHYSICIAN ASSISTANT:   ASSISTANTS: none   ANESTHESIA:   spinal  EBL:  Total I/O In: 700 [I.V.:700] Out: 900 [Urine:100; Emesis/NG output:300; Blood:500]  BLOOD ADMINISTERED:none  DRAINS: Urinary Catheter (Foley)   LOCAL MEDICATIONS USED:  NONE  SPECIMEN:  Source of Specimen:  portion of bilateral fallopian tube   DISPOSITION OF SPECIMEN:  PATHOLOGY  COUNTS:  YES  TOURNIQUET:  * No tourniquets in log *  DICTATION: .Other Dictation: Dictation Number verbal   PLAN OF CARE: Admit to inpatient   PATIENT DISPOSITION:  PACU - hemodynamically stable.   Delay start of Pharmacological VTE agent (>24hrs) due to surgical blood loss or risk of bleeding: not applicable

## 2015-06-01 NOTE — Progress Notes (Signed)
Infant born to a G2 now P2 mother, Infant was noted to be tachyneic 30 mins after birth. Infant respirations were 73 with very mild subcostal retractions. Infants oxygen saturation were monitor for 15 mins and ranged between 95-100%. Infant was suctioned and placed skin to skin. Retractions were short lived and resolved quickly, Respiratory rate was a downward trend when transfer to mother-baby unit Katherine Bentley

## 2015-06-02 LAB — CBC
HEMATOCRIT: 24.1 % — AB (ref 35.0–47.0)
Hemoglobin: 8 g/dL — ABNORMAL LOW (ref 12.0–16.0)
MCH: 31 pg (ref 26.0–34.0)
MCHC: 32.9 g/dL (ref 32.0–36.0)
MCV: 94 fL (ref 80.0–100.0)
PLATELETS: 211 10*3/uL (ref 150–440)
RBC: 2.57 MIL/uL — ABNORMAL LOW (ref 3.80–5.20)
RDW: 12.2 % (ref 11.5–14.5)
WBC: 11.6 10*3/uL — AB (ref 3.6–11.0)

## 2015-06-02 MED ORDER — LACTATED RINGERS IV BOLUS (SEPSIS)
500.0000 mL | Freq: Once | INTRAVENOUS | Status: AC
  Administered 2015-06-02: 500 mL via INTRAVENOUS

## 2015-06-02 MED ORDER — OXYCODONE-ACETAMINOPHEN 5-325 MG PO TABS
1.0000 | ORAL_TABLET | Freq: Four times a day (QID) | ORAL | Status: DC | PRN
  Administered 2015-06-02: 2 via ORAL
  Administered 2015-06-02 – 2015-06-03 (×3): 1 via ORAL
  Filled 2015-06-02 (×2): qty 1
  Filled 2015-06-02: qty 2
  Filled 2015-06-02: qty 1

## 2015-06-02 MED ORDER — SODIUM CHLORIDE 0.9 % IV BOLUS (SEPSIS): 500.0000 mL | Freq: Once | INTRAVENOUS | Status: DC

## 2015-06-02 MED ORDER — CALCIUM CARBONATE ANTACID 500 MG PO CHEW
1.0000 | CHEWABLE_TABLET | Freq: Three times a day (TID) | ORAL | Status: DC | PRN
  Administered 2015-06-02: 200 mg via ORAL
  Filled 2015-06-02: qty 1

## 2015-06-02 MED ORDER — FAMOTIDINE 20 MG PO TABS
20.0000 mg | ORAL_TABLET | Freq: Every day | ORAL | Status: DC
  Administered 2015-06-02 – 2015-06-03 (×2): 20 mg via ORAL
  Filled 2015-06-02 (×2): qty 1

## 2015-06-02 NOTE — Progress Notes (Signed)
Subjective: Postpartum Day 1: Cesarean Delivery Patient reports incisional pain, tolerating PO and no problems voiding.    Objective: Vital signs in last 24 hours: Temp:  [97.5 F (36.4 C)-98.6 F (37 C)] 98.6 F (37 C) (07/23 1111) Pulse Rate:  [54-79] 68 (07/23 1111) Resp:  [16-18] 18 (07/23 1111) BP: (94-108)/(42-64) 108/64 mmHg (07/23 1111) SpO2:  [98 %-100 %] 99 % (07/23 1111)  Physical Exam:  General: alert, cooperative and no distress Lochia: appropriate Uterine Fundus: firm Incision: healing well, no significant drainage, no dehiscence DVT Evaluation: No evidence of DVT seen on physical exam.   Recent Labs  05/31/15 1040 06/02/15 0515  HGB 11.9* 8.0*  HCT 35.2 24.1*    Assessment/Plan: Status post Cesarean section. Doing well postoperatively.  Continue current care. Dizziness with ambulation - hypotension- fluid bolus and orthostatic vital signs ordered.  Iron ordered. Consider transfusion if no improvement with fluids.   Christeen Douglas 06/02/2015, 11:38 AM

## 2015-06-03 MED ORDER — HYDROMORPHONE HCL 2 MG PO TABS
2.0000 mg | ORAL_TABLET | ORAL | Status: DC | PRN
  Administered 2015-06-03 – 2015-06-04 (×3): 2 mg via ORAL
  Filled 2015-06-03 (×3): qty 1

## 2015-06-03 MED ORDER — FERROUS SULFATE 325 (65 FE) MG PO TABS
325.0000 mg | ORAL_TABLET | Freq: Two times a day (BID) | ORAL | Status: DC
  Administered 2015-06-03 – 2015-06-04 (×3): 325 mg via ORAL
  Filled 2015-06-03 (×3): qty 1

## 2015-06-03 NOTE — Progress Notes (Signed)
Pt still c/o dizziness when up to BR/ walking. Dr. Dalbert Garnet was notified and offered to give the pt 1 unit of blood if the pt wanted. Pt declined the unit of blood at this time, but stated she would reconsider if she wasn't feeling better in the am.

## 2015-06-03 NOTE — Progress Notes (Signed)
Subjective: Postpartum Day 2: Cesarean Delivery Patient reports no problems voiding.  +dizziness after Percocet  Objective: Vital signs in last 24 hours: Temp:  [98.1 F (36.7 C)-98.6 F (37 C)] 98.1 F (36.7 C) (07/24 0344) Pulse Rate:  [53-68] 58 (07/24 0344) Resp:  [18-20] 18 (07/24 0344) BP: (102-124)/(46-67) 116/61 mmHg (07/24 0344) SpO2:  [99 %] 99 % (07/23 1111)  Physical Exam:  General: alert, cooperative and no distress Lochia: appropriate Uterine Fundus: firm Incision: healing well, no significant drainage, no dehiscence DVT Evaluation: No evidence of DVT seen on physical exam.   Recent Labs  05/31/15 1040 06/02/15 0515  HGB 11.9* 8.0*  HCT 35.2 24.1*    Assessment/Plan: Status post Cesarean section. Doing well postoperatively.  Continue current care. Change pain meds from percocet to po motrin, dilaudid  Katherine Bentley 06/03/2015, 9:34 AM

## 2015-06-03 NOTE — Clinical Documentation Improvement (Signed)
"  Dizziness with ambulation - hypotension- fluid bolus and orthostatic vital signs ordered. Iron ordered. Consider transfusion if no improvement with fluids." was documented in the progress note on 06/03/15. Please help clarify the underlying cause of the hypotension and need for administration of iron and fluid bolus.   Relevant labs: Component Hemoglobin HCT  Latest Ref Rng 12.0 - 16.0 g/dL 46.9 - 62.9 %  04/07/4131 8.0 (L) 24.1 (L)    Documentation Reminders: . Please provide the acuity of the condition: --Acute --Chronic --Acute on Chronic  . Please provide the underlying cause of the condition: --Chronic Disease --Due to blood loss --Nutritional --Other (please specify) --Unknown  Thank You, Saul Fordyce ,RN Clinical Documentation Specialist:  [phone]  [facility]- Health Information Management

## 2015-06-04 LAB — SURGICAL PATHOLOGY

## 2015-06-04 MED ORDER — HYDROMORPHONE HCL 2 MG PO TABS: 2.0000 mg | ORAL_TABLET | Freq: Four times a day (QID) | ORAL | Status: DC | PRN

## 2015-06-04 MED ORDER — ACETAMINOPHEN 325 MG PO TABS: 650.0000 mg | ORAL_TABLET | ORAL | Status: AC | PRN

## 2015-06-04 NOTE — Progress Notes (Signed)
Patient discharge to home via wheelchair with spouse and baby. 

## 2015-06-04 NOTE — Discharge Instructions (Signed)

## 2015-06-04 NOTE — Discharge Summary (Signed)
Obstetric Discharge Summary Reason for Admission: cesarean section Prenatal Procedures: none Intrapartum Procedures: cesarean: low cervical, transverse and tubal ligation Postpartum Procedures: none Complications-Operative and Postpartum: none HEMOGLOBIN  Date Value Ref Range Status  06/02/2015 8.0* 12.0 - 16.0 g/dL Final   HGB  Date Value Ref Range Status  01/22/2014 12.8 12.0-16.0 g/dL Final   HCT  Date Value Ref Range Status  06/02/2015 24.1* 35.0 - 47.0 % Final  01/22/2014 38.0 35.0-47.0 % Final    Physical Exam:  General: alert, cooperative and no distress Lochia: appropriate Uterine Fundus: firm Incision: healing well, no significant drainage, no dehiscence, no significant erythema DVT Evaluation: No evidence of DVT seen on physical exam.  Discharge Diagnoses: Term Pregnancy-delivered  Discharge Information: Date: 06/04/2015 Activity: pelvic rest Diet: routine Medications: PNV, Ibuprofen and dilaudid (Norco allergy) Condition: stable Instructions: refer to practice specific booklet Discharge to: home Follow-up Information    Follow up with SCHERMERHORN,THOMAS, MD In 2 weeks.   Specialty:  Obstetrics and Gynecology   Why:  For postop check   Contact information:   [address] [place] [address] [phone]       Newborn Data: Live born female  Birth Weight: 6 lb 4.5 oz (2850 g) APGAR: 8, 9  Home with mother.  Christeen Douglas 06/04/2015, 7:49 AM

## 2015-12-25 ENCOUNTER — Encounter: Payer: Self-pay | Admitting: Medical Oncology

## 2015-12-25 ENCOUNTER — Emergency Department: Payer: Self-pay

## 2015-12-25 ENCOUNTER — Emergency Department
Admission: EM | Admit: 2015-12-25 | Discharge: 2015-12-25 | Disposition: A | Discharge status: Home / Self Care | Payer: Self-pay | Attending: Emergency Medicine | Admitting: Emergency Medicine

## 2015-12-25 DIAGNOSIS — J9801 Acute bronchospasm: Secondary | ICD-10-CM | POA: Insufficient documentation

## 2015-12-25 DIAGNOSIS — F17210 Nicotine dependence, cigarettes, uncomplicated: Secondary | ICD-10-CM | POA: Insufficient documentation

## 2015-12-25 DIAGNOSIS — J209 Acute bronchitis, unspecified: Secondary | ICD-10-CM

## 2015-12-25 LAB — CBC WITH DIFFERENTIAL/PLATELET
Basophils Absolute: 0 10*3/uL (ref 0–0.1)
Basophils Relative: 0 %
Eosinophils Absolute: 0 10*3/uL (ref 0–0.7)
Eosinophils Relative: 1 %
HCT: 36.9 % (ref 35.0–47.0)
HEMOGLOBIN: 12.5 g/dL (ref 12.0–16.0)
Lymphocytes Relative: 25 %
Lymphs Abs: 0.8 10*3/uL — ABNORMAL LOW (ref 1.0–3.6)
MCH: 31.1 pg (ref 26.0–34.0)
MCHC: 33.9 g/dL (ref 32.0–36.0)
MCV: 91.8 fL (ref 80.0–100.0)
MONOS PCT: 13 %
Monocytes Absolute: 0.4 10*3/uL (ref 0.2–0.9)
NEUTROS PCT: 61 %
Neutro Abs: 2.1 10*3/uL (ref 1.4–6.5)
Platelets: 183 10*3/uL (ref 150–440)
RBC: 4.02 MIL/uL (ref 3.80–5.20)
RDW: 13.8 % (ref 11.5–14.5)
WBC: 3.4 10*3/uL — ABNORMAL LOW (ref 3.6–11.0)

## 2015-12-25 LAB — BASIC METABOLIC PANEL
Anion gap: 8 (ref 5–15)
BUN: 6 mg/dL (ref 6–20)
CHLORIDE: 106 mmol/L (ref 101–111)
CO2: 24 mmol/L (ref 22–32)
CREATININE: 0.74 mg/dL (ref 0.44–1.00)
Calcium: 8.7 mg/dL — ABNORMAL LOW (ref 8.9–10.3)
GFR calc Af Amer: >60 mL/min (ref >60)
GFR calc non Af Amer: >60 mL/min (ref >60)
Glucose, Bld: 114 mg/dL — ABNORMAL HIGH (ref 65–99)
Potassium: 3 mmol/L — ABNORMAL LOW (ref 3.5–5.1)
SODIUM: 138 mmol/L (ref 135–145)

## 2015-12-25 MED ORDER — AZITHROMYCIN 250 MG PO TABS: ORAL_TABLET | ORAL | Status: AC

## 2015-12-25 MED ORDER — POTASSIUM CHLORIDE CRYS ER 20 MEQ PO TBCR
40.0000 meq | EXTENDED_RELEASE_TABLET | Freq: Once | ORAL | Status: AC
  Administered 2015-12-25: 40 meq via ORAL
  Filled 2015-12-25: qty 2

## 2015-12-25 MED ORDER — ALBUTEROL SULFATE (2.5 MG/3ML) 0.083% IN NEBU
5.0000 mg | INHALATION_SOLUTION | Freq: Once | RESPIRATORY_TRACT | Status: AC
  Administered 2015-12-25: 5 mg via RESPIRATORY_TRACT
  Filled 2015-12-25: qty 6

## 2015-12-25 MED ORDER — IPRATROPIUM BROMIDE 0.02 % IN SOLN
0.5000 mg | Freq: Once | RESPIRATORY_TRACT | Status: AC
  Administered 2015-12-25: 0.5 mg via RESPIRATORY_TRACT
  Filled 2015-12-25: qty 2.5

## 2015-12-25 MED ORDER — PREDNISONE 20 MG PO TABS: 60.0000 mg | ORAL_TABLET | Freq: Every day | ORAL | Status: DC

## 2015-12-25 MED ORDER — SODIUM CHLORIDE 0.9 % IV BOLUS (SEPSIS)
1000.0000 mL | Freq: Once | INTRAVENOUS | Status: AC
  Administered 2015-12-25: 1000 mL via INTRAVENOUS

## 2015-12-25 MED ORDER — PREDNISONE 20 MG PO TABS
60.0000 mg | ORAL_TABLET | Freq: Once | ORAL | Status: AC
  Administered 2015-12-25: 60 mg via ORAL
  Filled 2015-12-25: qty 3

## 2015-12-25 MED ORDER — ALBUTEROL SULFATE HFA 108 (90 BASE) MCG/ACT IN AERS: 2.0000 | INHALATION_SPRAY | Freq: Four times a day (QID) | RESPIRATORY_TRACT | Status: AC | PRN

## 2015-12-25 NOTE — ED Notes (Signed)
Patient transported to X-ray 
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
## 2015-12-25 NOTE — ED Notes (Signed)
Pt reports that she got up this am and had a sudden feeling of being hot and light headed. Pt then began having sob. Pt states that she feels tight in her chest.

## 2017-09-23 ENCOUNTER — Other Ambulatory Visit: Payer: Self-pay

## 2017-09-23 ENCOUNTER — Encounter: Payer: Self-pay | Admitting: *Deleted

## 2017-09-23 ENCOUNTER — Emergency Department: Payer: Self-pay

## 2017-09-23 ENCOUNTER — Emergency Department
Admission: EM | Admit: 2017-09-23 | Discharge: 2017-09-23 | Disposition: A | Discharge status: Home / Self Care | Payer: Self-pay | Attending: Emergency Medicine | Admitting: Emergency Medicine

## 2017-09-23 DIAGNOSIS — R109 Unspecified abdominal pain: Secondary | ICD-10-CM

## 2017-09-23 DIAGNOSIS — K529 Noninfective gastroenteritis and colitis, unspecified: Secondary | ICD-10-CM | POA: Insufficient documentation

## 2017-09-23 DIAGNOSIS — F17210 Nicotine dependence, cigarettes, uncomplicated: Secondary | ICD-10-CM | POA: Insufficient documentation

## 2017-09-23 DIAGNOSIS — Z79899 Other long term (current) drug therapy: Secondary | ICD-10-CM | POA: Insufficient documentation

## 2017-09-23 HISTORY — DX: Unspecified asthma, uncomplicated: J45.909

## 2017-09-23 LAB — COMPREHENSIVE METABOLIC PANEL
ALBUMIN: 4.1 g/dL (ref 3.5–5.0)
ALT: 12 U/L — ABNORMAL LOW (ref 14–54)
AST: 20 U/L (ref 15–41)
Alkaline Phosphatase: 61 U/L (ref 38–126)
Anion gap: 8 (ref 5–15)
BUN: 9 mg/dL (ref 6–20)
CHLORIDE: 106 mmol/L (ref 101–111)
CO2: 23 mmol/L (ref 22–32)
Calcium: 9.5 mg/dL (ref 8.9–10.3)
Creatinine, Ser: 0.73 mg/dL (ref 0.44–1.00)
GFR calc Af Amer: >60 mL/min (ref >60)
GFR calc non Af Amer: >60 mL/min (ref >60)
GLUCOSE: 110 mg/dL — AB (ref 65–99)
Potassium: 3.8 mmol/L (ref 3.5–5.1)
Sodium: 137 mmol/L (ref 135–145)
Total Bilirubin: 0.7 mg/dL (ref 0.3–1.2)
Total Protein: 7.8 g/dL (ref 6.5–8.1)

## 2017-09-23 LAB — URINALYSIS, COMPLETE (UACMP) WITH MICROSCOPIC
BACTERIA UA: NONE SEEN
Bilirubin Urine: NEGATIVE
Glucose, UA: NEGATIVE mg/dL
Hgb urine dipstick: NEGATIVE
KETONES UR: NEGATIVE mg/dL
LEUKOCYTES UA: NEGATIVE
Nitrite: NEGATIVE
PH: 8 (ref 5.0–8.0)
Protein, ur: NEGATIVE mg/dL
RBC / HPF: NONE SEEN RBC/hpf (ref 0–5)
Specific Gravity, Urine: 1.016 (ref 1.005–1.030)

## 2017-09-23 LAB — CBC
HEMATOCRIT: 43 % (ref 35.0–47.0)
Hemoglobin: 14.4 g/dL (ref 12.0–16.0)
MCH: 32 pg (ref 26.0–34.0)
MCHC: 33.6 g/dL (ref 32.0–36.0)
MCV: 95.4 fL (ref 80.0–100.0)
Platelets: 248 10*3/uL (ref 150–440)
RBC: 4.51 MIL/uL (ref 3.80–5.20)
RDW: 12.3 % (ref 11.5–14.5)
WBC: 16 10*3/uL — ABNORMAL HIGH (ref 3.6–11.0)

## 2017-09-23 LAB — POCT PREGNANCY, URINE: Preg Test, Ur: NEGATIVE

## 2017-09-23 LAB — LIPASE, BLOOD: LIPASE: 38 U/L (ref 11–51)

## 2017-09-23 MED ORDER — IOPAMIDOL (ISOVUE-300) INJECTION 61%
15.0000 mL | INTRAVENOUS | Status: AC
  Administered 2017-09-23: 15 mL via ORAL
  Filled 2017-09-23 (×2): qty 15

## 2017-09-23 MED ORDER — SODIUM CHLORIDE 0.9 % IV BOLUS (SEPSIS)
1000.0000 mL | Freq: Once | INTRAVENOUS | Status: AC
  Administered 2017-09-23: 1000 mL via INTRAVENOUS

## 2017-09-23 MED ORDER — TRAMADOL HCL 50 MG PO TABS: 50.0000 mg | ORAL_TABLET | Freq: Four times a day (QID) | ORAL | 0 refills | Status: DC | PRN

## 2017-09-23 MED ORDER — ONDANSETRON HCL 4 MG/2ML IJ SOLN
4.0000 mg | Freq: Once | INTRAMUSCULAR | Status: AC
  Administered 2017-09-23: 4 mg via INTRAVENOUS
  Filled 2017-09-23: qty 2

## 2017-09-23 MED ORDER — IOPAMIDOL (ISOVUE-300) INJECTION 61%
55.0000 mL | Freq: Once | INTRAVENOUS | Status: AC | PRN
  Administered 2017-09-23: 55 mL via INTRAVENOUS
  Filled 2017-09-23: qty 60

## 2017-09-23 MED ORDER — FENTANYL CITRATE (PF) 100 MCG/2ML IJ SOLN
50.0000 ug | Freq: Once | INTRAMUSCULAR | Status: AC
  Administered 2017-09-23: 50 ug via INTRAVENOUS
  Filled 2017-09-23: qty 2

## 2017-09-23 MED ORDER — DICYCLOMINE HCL 20 MG PO TABS: 20.0000 mg | ORAL_TABLET | Freq: Three times a day (TID) | ORAL | 0 refills | Status: AC | PRN
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
## 2017-09-23 NOTE — ED Notes (Signed)
Pt discharged home after verbalizing understanding of discharge instructions; nad noted. 

## 2017-09-23 NOTE — Discharge Instructions (Signed)
As we discussed return to the emergency department for any worsening abdominal pain, fever, or any other symptom personally concerning to yourself.  Otherwise please follow-up with your primary care doctor for further evaluation.  As we discussed her CT scan does show an area next to right ovary, please have this evaluated by your primary care doctor or OB/GYN with an ultrasound on a nonemergent basis within the next 6-8 weeks.

## 2017-09-23 NOTE — ED Triage Notes (Signed)
Pt complains of abdominal pain with bloating starting last night, pt denies any other symptoms

## 2018-10-28 ENCOUNTER — Other Ambulatory Visit: Payer: Self-pay

## 2018-10-28 ENCOUNTER — Emergency Department
Admission: EM | Admit: 2018-10-28 | Discharge: 2018-10-28 | Disposition: A | Discharge status: Home / Self Care | Payer: Self-pay | Attending: Emergency Medicine | Admitting: Emergency Medicine

## 2018-10-28 DIAGNOSIS — J45909 Unspecified asthma, uncomplicated: Secondary | ICD-10-CM | POA: Insufficient documentation

## 2018-10-28 DIAGNOSIS — J0100 Acute maxillary sinusitis, unspecified: Secondary | ICD-10-CM | POA: Insufficient documentation

## 2018-10-28 DIAGNOSIS — F17210 Nicotine dependence, cigarettes, uncomplicated: Secondary | ICD-10-CM | POA: Insufficient documentation

## 2018-10-28 DIAGNOSIS — Z79899 Other long term (current) drug therapy: Secondary | ICD-10-CM | POA: Insufficient documentation

## 2018-10-28 DIAGNOSIS — R0981 Nasal congestion: Secondary | ICD-10-CM | POA: Insufficient documentation

## 2018-10-28 DIAGNOSIS — K0889 Other specified disorders of teeth and supporting structures: Secondary | ICD-10-CM | POA: Insufficient documentation

## 2018-10-28 MED ORDER — AMOXICILLIN-POT CLAVULANATE 875-125 MG PO TABS: 1.0000 | ORAL_TABLET | Freq: Two times a day (BID) | ORAL | 0 refills | Status: DC

## 2018-10-28 MED ORDER — OXYCODONE-ACETAMINOPHEN 5-325 MG PO TABS
1.0000 | ORAL_TABLET | Freq: Once | ORAL | Status: AC
  Administered 2018-10-28: 1 via ORAL
  Filled 2018-10-28: qty 1

## 2018-10-28 MED ORDER — AMOXICILLIN-POT CLAVULANATE 875-125 MG PO TABS
1.0000 | ORAL_TABLET | Freq: Once | ORAL | Status: AC
  Administered 2018-10-28: 1 via ORAL
  Filled 2018-10-28: qty 1

## 2018-10-28 MED ORDER — FLUTICASONE PROPIONATE 50 MCG/ACT NA SUSP: 1.0000 | Freq: Two times a day (BID) | NASAL | 0 refills | Status: DC

## 2018-10-28 MED ORDER — CETIRIZINE HCL 10 MG PO TABS: 10.0000 mg | ORAL_TABLET | Freq: Every day | ORAL | 0 refills | Status: AC
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
## 2018-10-28 NOTE — ED Notes (Signed)
Patient discharged to home per MD order. Patient in stable condition, and deemed medically cleared by ED provider for discharge. Discharge instructions reviewed with patient/family using "Teach Back"; verbalized understanding of medication education and administration, and information about follow-up care. Denies further concerns. ° °

## 2018-10-28 NOTE — ED Notes (Signed)
Pt in with co left sided jaw pain and swelling that started today. Hx of dental abscess, states also co "nose burning". States does have bad teeth on that side.

## 2018-10-28 NOTE — ED Triage Notes (Signed)
Patient c/o left dental pain and sinus congestion described as burning.

## 2021-12-11 ENCOUNTER — Other Ambulatory Visit: Payer: Self-pay

## 2021-12-11 ENCOUNTER — Ambulatory Visit
Admission: EM | Admit: 2021-12-11 | Discharge: 2021-12-12 | Disposition: A | Discharge status: Home / Self Care | Payer: Self-pay | Attending: Emergency Medicine | Admitting: Emergency Medicine

## 2021-12-11 ENCOUNTER — Emergency Department: Payer: Self-pay

## 2021-12-11 ENCOUNTER — Encounter
Admission: EM | Disposition: A | Discharge status: Home / Self Care | Payer: Self-pay | Source: Home / Self Care | Attending: Emergency Medicine

## 2021-12-11 ENCOUNTER — Emergency Department: Payer: Self-pay | Admitting: Registered Nurse

## 2021-12-11 ENCOUNTER — Encounter: Payer: Self-pay | Admitting: Emergency Medicine

## 2021-12-11 DIAGNOSIS — K661 Hemoperitoneum: Secondary | ICD-10-CM

## 2021-12-11 DIAGNOSIS — J45909 Unspecified asthma, uncomplicated: Secondary | ICD-10-CM | POA: Insufficient documentation

## 2021-12-11 DIAGNOSIS — R1032 Left lower quadrant pain: Secondary | ICD-10-CM

## 2021-12-11 DIAGNOSIS — Z302 Encounter for sterilization: Secondary | ICD-10-CM | POA: Insufficient documentation

## 2021-12-11 DIAGNOSIS — Z20822 Contact with and (suspected) exposure to covid-19: Secondary | ICD-10-CM | POA: Insufficient documentation

## 2021-12-11 DIAGNOSIS — O00202 Left ovarian pregnancy without intrauterine pregnancy: Secondary | ICD-10-CM

## 2021-12-11 DIAGNOSIS — O00102 Left tubal pregnancy without intrauterine pregnancy: Secondary | ICD-10-CM | POA: Insufficient documentation

## 2021-12-11 DIAGNOSIS — F17210 Nicotine dependence, cigarettes, uncomplicated: Secondary | ICD-10-CM | POA: Insufficient documentation

## 2021-12-11 HISTORY — PX: LAPAROSCOPIC BILATERAL SALPINGECTOMY: SHX5889

## 2021-12-11 LAB — CBC WITH DIFFERENTIAL/PLATELET
Abs Immature Granulocytes: 0.03 10*3/uL (ref 0.00–0.07)
Basophils Absolute: 0.1 10*3/uL (ref 0.0–0.1)
Basophils Relative: 1 %
Eosinophils Absolute: 0.1 10*3/uL (ref 0.0–0.5)
Eosinophils Relative: 1 %
HCT: 39 % (ref 36.0–46.0)
Hemoglobin: 13.9 g/dL (ref 12.0–15.0)
Immature Granulocytes: 0 %
Lymphocytes Relative: 28 %
Lymphs Abs: 2.3 10*3/uL (ref 0.7–4.0)
MCH: 34.7 pg — ABNORMAL HIGH (ref 26.0–34.0)
MCHC: 35.6 g/dL (ref 30.0–36.0)
MCV: 97.3 fL (ref 80.0–100.0)
Monocytes Absolute: 0.5 10*3/uL (ref 0.1–1.0)
Monocytes Relative: 6 %
Neutro Abs: 5.3 10*3/uL (ref 1.7–7.7)
Neutrophils Relative %: 64 %
Platelets: 239 10*3/uL (ref 150–400)
RBC: 4.01 MIL/uL (ref 3.87–5.11)
RDW: 11 % — ABNORMAL LOW (ref 11.5–15.5)
WBC: 8.4 10*3/uL (ref 4.0–10.5)
nRBC: 0 % (ref 0.0–0.2)

## 2021-12-11 LAB — COMPREHENSIVE METABOLIC PANEL
ALT: 13 U/L (ref 0–44)
AST: 17 U/L (ref 15–41)
Albumin: 4.2 g/dL (ref 3.5–5.0)
Alkaline Phosphatase: 52 U/L (ref 38–126)
Anion gap: 12 (ref 5–15)
BUN: 9 mg/dL (ref 6–20)
CO2: 20 mmol/L — ABNORMAL LOW (ref 22–32)
Calcium: 9.3 mg/dL (ref 8.9–10.3)
Chloride: 102 mmol/L (ref 98–111)
Creatinine, Ser: 0.71 mg/dL (ref 0.44–1.00)
GFR, Estimated: >60 mL/min (ref >60)
Glucose, Bld: 134 mg/dL — ABNORMAL HIGH (ref 70–99)
Potassium: 3.2 mmol/L — ABNORMAL LOW (ref 3.5–5.1)
Sodium: 134 mmol/L — ABNORMAL LOW (ref 135–145)
Total Bilirubin: 1 mg/dL (ref 0.3–1.2)
Total Protein: 7.4 g/dL (ref 6.5–8.1)

## 2021-12-11 LAB — RESP PANEL BY RT-PCR (FLU A&B, COVID) ARPGX2
Influenza A by PCR: NEGATIVE
Influenza B by PCR: NEGATIVE
SARS Coronavirus 2 by RT PCR: NEGATIVE

## 2021-12-11 LAB — TYPE AND SCREEN
ABO/RH(D): A POS
Antibody Screen: NEGATIVE

## 2021-12-11 LAB — HCG, QUANTITATIVE, PREGNANCY: hCG, Beta Chain, Quant, S: 4060 m[IU]/mL — ABNORMAL HIGH (ref <5)

## 2021-12-11 LAB — LIPASE, BLOOD: Lipase: 34 U/L (ref 11–51)

## 2021-12-11 SURGERY — SALPINGECTOMY, BILATERAL, LAPAROSCOPIC
Anesthesia: General | Laterality: Left

## 2021-12-11 MED ORDER — SUCCINYLCHOLINE CHLORIDE 200 MG/10ML IV SOSY
PREFILLED_SYRINGE | INTRAVENOUS | Status: DC | PRN
  Administered 2021-12-11: 100 mg via INTRAVENOUS

## 2021-12-11 MED ORDER — PHENYLEPHRINE HCL (PRESSORS) 10 MG/ML IV SOLN
INTRAVENOUS | Status: DC | PRN
  Administered 2021-12-11: 80 ug via INTRAVENOUS
  Administered 2021-12-11: 40 ug via INTRAVENOUS
  Administered 2021-12-11: 80 ug via INTRAVENOUS
  Administered 2021-12-11: 160 ug via INTRAVENOUS

## 2021-12-11 MED ORDER — SODIUM CHLORIDE 0.9 % IR SOLN
Status: DC | PRN
  Administered 2021-12-11: 1000 mL

## 2021-12-11 MED ORDER — BUPIVACAINE LIPOSOME 1.3 % IJ SUSP
INTRAMUSCULAR | Status: AC
  Filled 2021-12-11: qty 20

## 2021-12-11 MED ORDER — BUPIVACAINE LIPOSOME 1.3 % IJ SUSP
INTRAMUSCULAR | Status: DC | PRN
  Administered 2021-12-11: 20 mL

## 2021-12-11 MED ORDER — MIDAZOLAM HCL 2 MG/2ML IJ SOLN
INTRAMUSCULAR | Status: DC | PRN
  Administered 2021-12-11: 2 mg via INTRAVENOUS

## 2021-12-11 MED ORDER — MEPERIDINE HCL 25 MG/ML IJ SOLN: 6.2500 mg | INTRAMUSCULAR | Status: DC | PRN

## 2021-12-11 MED ORDER — DEXAMETHASONE SODIUM PHOSPHATE 10 MG/ML IJ SOLN
INTRAMUSCULAR | Status: DC | PRN
  Administered 2021-12-11: 5 mg via INTRAVENOUS

## 2021-12-11 MED ORDER — FENTANYL CITRATE (PF) 100 MCG/2ML IJ SOLN
INTRAMUSCULAR | Status: DC | PRN
  Administered 2021-12-11 (×2): 50 ug via INTRAVENOUS

## 2021-12-11 MED ORDER — SODIUM CHLORIDE 0.9 % IV SOLN: INTRAVENOUS | Status: DC | PRN

## 2021-12-11 MED ORDER — POVIDONE-IODINE 10 % EX SWAB: 2.0000 "application " | Freq: Once | CUTANEOUS | Status: DC

## 2021-12-11 MED ORDER — ONDANSETRON HCL 4 MG/2ML IJ SOLN: 4.0000 mg | Freq: Once | INTRAMUSCULAR | Status: DC | PRN

## 2021-12-11 MED ORDER — FENTANYL CITRATE (PF) 100 MCG/2ML IJ SOLN
INTRAMUSCULAR | Status: AC
  Administered 2021-12-11: 25 ug via INTRAVENOUS
  Filled 2021-12-11: qty 2

## 2021-12-11 MED ORDER — PROPOFOL 500 MG/50ML IV EMUL
INTRAVENOUS | Status: AC
  Filled 2021-12-11: qty 50

## 2021-12-11 MED ORDER — FENTANYL CITRATE (PF) 100 MCG/2ML IJ SOLN
INTRAMUSCULAR | Status: AC
  Filled 2021-12-11: qty 2

## 2021-12-11 MED ORDER — ACETAMINOPHEN 10 MG/ML IV SOLN
INTRAVENOUS | Status: DC | PRN
  Administered 2021-12-11: 500 mg via INTRAVENOUS

## 2021-12-11 MED ORDER — PROPOFOL 10 MG/ML IV BOLUS
INTRAVENOUS | Status: DC | PRN
  Administered 2021-12-11: 140 mg via INTRAVENOUS

## 2021-12-11 MED ORDER — SUGAMMADEX SODIUM 200 MG/2ML IV SOLN
INTRAVENOUS | Status: DC | PRN
  Administered 2021-12-11: 100 mg via INTRAVENOUS

## 2021-12-11 MED ORDER — SODIUM CHLORIDE FLUSH 0.9 % IV SOLN
INTRAVENOUS | Status: AC
  Filled 2021-12-11: qty 10

## 2021-12-11 MED ORDER — ONDANSETRON HCL 4 MG/2ML IJ SOLN
4.0000 mg | INTRAMUSCULAR | Status: AC
  Administered 2021-12-11: 4 mg via INTRAVENOUS
  Filled 2021-12-11: qty 2

## 2021-12-11 MED ORDER — IBUPROFEN 600 MG PO TABS: 600.0000 mg | ORAL_TABLET | Freq: Four times a day (QID) | ORAL | 0 refills | Status: AC | PRN

## 2021-12-11 MED ORDER — LACTATED RINGERS IV SOLN: INTRAVENOUS | Status: DC

## 2021-12-11 MED ORDER — TRAMADOL HCL 50 MG PO TABS
50.0000 mg | ORAL_TABLET | Freq: Four times a day (QID) | ORAL | 0 refills | Status: AC | PRN
Start: 2021-12-11 — End: 2022-12-11

## 2021-12-11 MED ORDER — ONDANSETRON HCL 4 MG/2ML IJ SOLN
INTRAMUSCULAR | Status: AC
  Filled 2021-12-11: qty 2

## 2021-12-11 MED ORDER — FENTANYL CITRATE (PF) 100 MCG/2ML IJ SOLN
25.0000 ug | INTRAMUSCULAR | Status: DC | PRN
  Administered 2021-12-11: 25 ug via INTRAVENOUS

## 2021-12-11 MED ORDER — ROCURONIUM BROMIDE 100 MG/10ML IV SOLN
INTRAVENOUS | Status: DC | PRN
  Administered 2021-12-11: 25 mg via INTRAVENOUS

## 2021-12-11 MED ORDER — ACETAMINOPHEN 10 MG/ML IV SOLN
INTRAVENOUS | Status: AC
  Filled 2021-12-11: qty 100

## 2021-12-11 MED ORDER — SODIUM CHLORIDE 0.9 % IV BOLUS
1000.0000 mL | Freq: Once | INTRAVENOUS | Status: AC
Start: 2021-12-11 — End: 2021-12-11
  Administered 2021-12-11: 1000 mL via INTRAVENOUS

## 2021-12-11 MED ORDER — ONDANSETRON HCL 4 MG/2ML IJ SOLN
INTRAMUSCULAR | Status: DC | PRN
  Administered 2021-12-11: 4 mg via INTRAVENOUS

## 2021-12-11 MED ORDER — MIDAZOLAM HCL 2 MG/2ML IJ SOLN
INTRAMUSCULAR | Status: AC
  Filled 2021-12-11: qty 2

## 2021-12-11 MED ORDER — LIDOCAINE HCL (CARDIAC) PF 100 MG/5ML IV SOSY
PREFILLED_SYRINGE | INTRAVENOUS | Status: DC | PRN
  Administered 2021-12-11: 50 mg via INTRAVENOUS

## 2021-12-11 MED ORDER — FENTANYL CITRATE PF 50 MCG/ML IJ SOSY
50.0000 ug | PREFILLED_SYRINGE | Freq: Once | INTRAMUSCULAR | Status: AC
  Administered 2021-12-11: 50 ug via INTRAVENOUS
  Filled 2021-12-11: qty 1

## 2021-12-11 MED ORDER — DEXMEDETOMIDINE (PRECEDEX) IN NS 20 MCG/5ML (4 MCG/ML) IV SYRINGE
PREFILLED_SYRINGE | INTRAVENOUS | Status: DC | PRN
  Administered 2021-12-11 (×2): 4 ug via INTRAVENOUS

## 2021-12-11 SURGICAL SUPPLY — 44 items
BACTOSHIELD CHG 4% 4OZ (MISCELLANEOUS) ×1
BAG URINE DRAIN 2000ML AR STRL (UROLOGICAL SUPPLIES) ×2 IMPLANT
BLADE SURG SZ11 CARB STEEL (BLADE) ×2 IMPLANT
CATH FOLEY 2WAY  5CC 16FR (CATHETERS) ×1
CATH URTH 16FR FL 2W BLN LF (CATHETERS) ×1 IMPLANT
DERMABOND ADVANCED (GAUZE/BANDAGES/DRESSINGS) ×1
DERMABOND ADVANCED .7 DNX12 (GAUZE/BANDAGES/DRESSINGS) ×1 IMPLANT
DRAPE 3/4 80X56 (DRAPES) ×2 IMPLANT
DRAPE UNDER BUTTOCK W/FLU (DRAPES) ×2 IMPLANT
ELECT REM PT RETURN 9FT ADLT (ELECTROSURGICAL) ×2
ELECTRODE REM PT RTRN 9FT ADLT (ELECTROSURGICAL) ×1 IMPLANT
GAUZE 4X4 16PLY ~~LOC~~+RFID DBL (SPONGE) ×3 IMPLANT
GLOVE SURG SYN 6.5 ES PF (GLOVE) ×4 IMPLANT
GLOVE SURG SYN 6.5 PF PI (GLOVE) ×2 IMPLANT
GLOVE SURG UNDER POLY LF SZ6.5 (GLOVE) ×2 IMPLANT
GOWN STRL REUS W/ TWL LRG LVL3 (GOWN DISPOSABLE) ×2 IMPLANT
GOWN STRL REUS W/TWL LRG LVL3 (GOWN DISPOSABLE) ×2
GRASPER SUT TROCAR 14GX15 (MISCELLANEOUS) ×1 IMPLANT
IRRIGATION STRYKERFLOW (MISCELLANEOUS) ×1 IMPLANT
IRRIGATOR STRYKERFLOW (MISCELLANEOUS) ×2
IV LACTATED RINGERS 1000ML (IV SOLUTION) ×2 IMPLANT
KIT PINK PAD W/HEAD ARE REST (MISCELLANEOUS) ×2 IMPLANT
KIT PINK PAD W/HEAD ARM REST (MISCELLANEOUS) ×1 IMPLANT
KIT TURNOVER CYSTO (KITS) ×2 IMPLANT
LABEL OR SOLS (LABEL) ×2 IMPLANT
LIGASURE VESSEL 5MM BLUNT TIP (ELECTROSURGICAL) ×1 IMPLANT
MANIFOLD NEPTUNE II (INSTRUMENTS) ×2 IMPLANT
MANIPULATOR UTERINE 4.5 ZUMI (MISCELLANEOUS) ×1 IMPLANT
NEEDLE HYPO 22GX1.5 SAFETY (NEEDLE) ×2 IMPLANT
NS IRRIG 500ML POUR BTL (IV SOLUTION) ×2 IMPLANT
PACK GYN LAPAROSCOPIC (MISCELLANEOUS) ×2 IMPLANT
PAD PREP 24X41 OB/GYN DISP (PERSONAL CARE ITEMS) ×2 IMPLANT
POUCH SPECIMEN RETRIEVAL 10MM (ENDOMECHANICALS) ×1 IMPLANT
SCRUB CHG 4% DYNA-HEX 4OZ (MISCELLANEOUS) ×1 IMPLANT
SET TUBE SMOKE EVAC HIGH FLOW (TUBING) ×2 IMPLANT
SLEEVE ENDOPATH XCEL 5M (ENDOMECHANICALS) ×3 IMPLANT
SUT MNCRL 4-0 (SUTURE) ×1
SUT MNCRL 4-0 27XMFL (SUTURE) ×1
SUT VIC AB 0 CT1 36 (SUTURE) ×3 IMPLANT
SUT VIC AB 2-0 UR6 27 (SUTURE) ×2 IMPLANT
SUTURE MNCRL 4-0 27XMF (SUTURE) IMPLANT
TROCAR ENDO BLADELESS 11MM (ENDOMECHANICALS) ×2 IMPLANT
TROCAR XCEL NON-BLD 5MMX100MML (ENDOMECHANICALS) ×2 IMPLANT
WATER STERILE IRR 500ML POUR (IV SOLUTION) ×2 IMPLANT

## 2021-12-11 NOTE — ED Provider Notes (Signed)
[facility] Provider Note    Event Date/Time   First MD Initiated Contact with Patient 12/11/21 [phone]     (approximate)  History   Chief Complaint: Flank Pain  HPI  Katherine Bentley is a 36 y.o. female with a past medical history of ovarian cyst presents to the emergency department for left flank pain.  According to the patient since yesterday evening she has been experiencing intermittent significant left flank pain.  Patient scribes as sharp sometimes in the back sometimes radiating around to her left lower quadrant.  Patient states she has had ovarian cyst in the past with similar presentation of that this feels worse per patient.  Denies any vaginal bleeding or discharge.  Does state her last menstrual period was approximately 6 weeks ago which is somewhat atypical.  Patient states slight pressure sensation when urinating but denies any burning.  No fever.  Patient did have an episode of vomiting due to the pain.  No diarrhea.  Physical Exam   Triage Vital Signs: ED Triage Vitals [12/11/21 0605]  Enc Vitals Group     BP 137/87     Pulse Rate 64     Resp 18     Temp (!) 97.5 F (36.4 C)     Temp Source Oral     SpO2 99 %     Weight 80 lb (36.3 kg)     Height 4\' 9"  (1.448 m)     Head Circumference      Peak Flow      Pain Score 10     Pain Loc      Pain Edu?      Excl. in GC?     Most recent vital signs: Vitals:   12/11/21 0652 12/11/21 0700  BP: (!) 147/69 (!) 137/94  Pulse: (!) 51 (!) 51  Resp: 17 14  Temp:    SpO2: 100% 100%    General: Awake, no distress.  Overall well-appearing. CV:  Good peripheral perfusion.  Regular rate and rhythm  Resp:  Normal effort.  Equal breath sounds bilaterally.  Abd:  No distention.  Soft, mild left lower quadrant tenderness palpation without rebound guarding or distention.    ED Results / Procedures / Treatments   RADIOLOGY  Ultrasound shows no IUP, complex left adnexal mass concerning for  possible ectopic pregnancy.   MEDICATIONS ORDERED IN ED: Medications  sodium chloride 0.9 % bolus 1,000 mL (has no administration in time range)  fentaNYL (SUBLIMAZE) injection 50 mcg (50 mcg Intravenous Given 12/11/21 0651)  ondansetron (ZOFRAN) injection 4 mg (4 mg Intravenous Given 12/11/21 0651)     IMPRESSION / MDM / ASSESSMENT AND PLAN / ED COURSE  I reviewed the triage vital signs and the nursing notes.  Patient presents to the emergency department for left flank/left lower quadrant pain beginning yesterday evening.  Patient states nausea and vomiting overnight due to the pain.  States a slight pressure when she urinates.  Differential is quite broad at this time but would include UTI/pyelonephritis, ureterolithiasis, ovarian cyst, torsion, ectopic.  Lab work is pending, patient will likely require ultrasound for CT depending on lab work.  Patient's beta-hCG is 4060 consistent with pregnancy, concern for possible ectopic pregnancy.  We will obtain an urgent ultrasound to evaluate.  Patient's pain is controlled after receiving fentanyl.  Patient's ultrasound is concerning for ectopic pregnancy.  I have added on a type and screen as well as a COVID test for preop purposes.  I spoke to Dr. Jerene Pitch of OB/GYN who will be down to see the patient and likely take to the OR.  Patient remains hemodynamically stable blood pressure is normal to hypertensive pulse rate after pain medication has been between 50 and 60 bpm.  States her pain is well controlled.  Reassuringly hemoglobin is normal.  Chemistry is normal.  Patient states in 2016 she had her tubes tied.  I reviewed the patient's discharge summary from 06/04/2015 in which the patient had a C-section as well as a tubal ligation.     FINAL CLINICAL IMPRESSION(S) / ED DIAGNOSES   Ectopic pregnancy Left lower quadrant pain   Note:  This document was prepared using Dragon voice recognition software and may include unintentional dictation errors.    Minna Antis, MD 12/11/21 [phone]

## 2021-12-11 NOTE — Anesthesia Postprocedure Evaluation (Signed)
Anesthesia Post Note  Patient: Katherine Bentley  Procedure(s) Performed: LAPAROSCOPIC BILATERAL SALPINGECTOMY (Left)  Patient location during evaluation: PACU Anesthesia Type: General Level of consciousness: awake and alert, awake and oriented Pain management: pain level controlled Vital Signs Assessment: post-procedure vital signs reviewed and stable Respiratory status: spontaneous breathing, nonlabored ventilation and respiratory function stable Cardiovascular status: blood pressure returned to baseline and stable Postop Assessment: no apparent nausea or vomiting Anesthetic complications: no   No notable events documented.   Last Vitals:  Vitals:   12/11/21 1300 12/11/21 1322  BP: 123/80 120/79  Pulse: 91 76  Resp: 14 14  Temp: (!) 36.4 C 36.8 C  SpO2: 100% 100%    Last Pain:  Vitals:   12/11/21 1322  TempSrc: Temporal  PainSc: 2                  Phill Mutter

## 2021-12-11 NOTE — Op Note (Signed)
°  Operative Note   12/11/2021  PRE-OP DIAGNOSIS: Ruptured ectopic pregnancy, desire for sterilization  POST-OP DIAGNOSIS: same   PROCEDURE: Procedure(s): LAPAROSCOPIC BILATERAL SALPINGECTOMY   SURGEON: Magon Croson MD  ANESTHESIA: General   ESTIMATED BLOOD LOSS: 10 cc  COMPLICATIONS: None  DISPOSITION: PACU - hemodynamically stable.  CONDITION: stable  FINDINGS: Laparoscopic survey of the abdomen revealed a grossly normal uterus, tubes, ovaries, liver edge, gallbladder edge and appendix, Significant intra-abdominal adhesions between the uterus and the anterior abdominal wall were noted.   PROCEDURE IN DETAIL: The patient was taken to the OR where anesthesia was administed. The patient was positioned in dorsal lithotomy in the [place] stirrups. The patient was then examined under anesthesia with the above noted findings. The patient was prepped and draped in the normal sterile fashion and bladder was drained using a foley catheter. Speculum exam normal, uterus sounded to 6 cm and a zZmi manipulator was placed for manipulation purposes.  Attention was turned to the patients abdomen where a 5 mm skin incision was made in the umbilical fold, after injection of local anesthesia.  The 5 mm port was then placed under direct visualization with the operative laparoscope  The above noted findings. Pneumoperitoneum was obtained. Patient positioned into Trendelenburg.  A 5 mm trocar was then placed in the left and right lower quadrants under direct visualization with the laparoscope.  The umbilical 5 mm port was exchanged with a 10 mm port. A fourth port was placed in the left upper quadrant at palmers point. The right and left fallopian tubes are identified and followed out to their fimbria. The left fallopian tube had a substantial clot between the fallopian tube and the left ovary which appeared to be the site of the ruptured ectopic pregnancy. Each tube was excised utilizing the Ligasure to  include the fibria. The fallopian tubes were placed into a bag and removed through the umbilical port. The PMI device was used to close the fascia of the umbilical port. The pelvis was irrigated and cleared of clots and debris. Excellent hemostasis was noted.  No injuries or bleeding was noted.  All instruments and ports were then removed from the abdomen after gas was expelled and patient was leveled.   The skin was closed with 4-0 monocryl and skin adhesive. The foley catheter was removed from the bladder. The Zumi manipulator was removed from the uterus.  The patient tolerated the procedure well. All counts were correct x 2. The patient was transferred to the recovery room awake, alert and breathing independently.  Adelene Idler MD [facility], [facility] 12/11/2021 12:46 PM
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
## 2021-12-11 NOTE — ED Notes (Signed)
Pt transported to OR. This RN notified John (tech) to let OR RN know that lab just told me that Type and Screen was hemolyzed.

## 2021-12-11 NOTE — Transfer of Care (Signed)
Immediate Anesthesia Transfer of Care Note  Patient: Katherine Bentley  Procedure(s) Performed: LAPAROSCOPIC BILATERAL SALPINGECTOMY (Left)  Patient Location: PACU  Anesthesia Type:General  Level of Consciousness: awake  Airway & Oxygen Therapy: Patient Spontanous Breathing  Post-op Assessment: Report given to RN and Post -op Vital signs reviewed and stable  Post vital signs: Reviewed and stable  Last Vitals:  Vitals Value Taken Time  BP 138/94 12/11/21 1241  Temp    Pulse 101 12/11/21 1245  Resp 15 12/11/21 1245  SpO2 98 % 12/11/21 1245  Vitals shown include unvalidated device data.  Last Pain:  Vitals:   12/11/21 1033  TempSrc: Temporal  PainSc: 5          Complications: No notable events documented.

## 2021-12-11 NOTE — ED Triage Notes (Signed)
Pt ambulatory to triage, appears uncomfortable, dry heaving; c/o left flank/side pain since yesterday accomp by N/V, increasing in intensity throughout the night; denies hx of same
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
## 2021-12-11 NOTE — Anesthesia Procedure Notes (Addendum)
Procedure Name: Intubation Date/Time: 12/11/2021 10:55 AM Performed by: Cheral Bay, CRNA Pre-anesthesia Checklist: Patient identified, Emergency Drugs available, Suction available and Patient being monitored Patient Re-evaluated:Patient Re-evaluated prior to induction Oxygen Delivery Method: Circle system utilized Preoxygenation: Pre-oxygenation with 100% oxygen Induction Type: IV induction and Rapid sequence Laryngoscope Size: McGraph and 3 Grade View: Grade I Tube type: Oral Tube size: 6.0 mm Number of attempts: 1 Airway Equipment and Method: Stylet Placement Confirmation: ETT inserted through vocal cords under direct vision, positive ETCO2 and breath sounds checked- equal and bilateral Secured at: 18 cm Tube secured with: Tape Dental Injury: Teeth and Oropharynx as per pre-operative assessment  Comments: Unable to pass ETT further than 18. Bilateral breath sounds. No leak with cuff uninflated.

## 2021-12-11 NOTE — Discharge Instructions (Addendum)
Postoperative Instructions  Take Motrin 600 mg and Tylenol 1000 mg every 6 hours for pain control.   I recommend taking this medication consistently for at least 1 week after your surgery.  Take Tramadol 50 mg as needed every 6 hours for severe pain.  Some people will take this before bed to make it easier to sleep.  Do not take this pain medicine longer than 7 days after your surgery.  Please take small walks around your living space every 2-3 hours after your surgery.  You can start this the day of your surgery or the day after your surgery.  Continue this for the first 7 days after surgery. After the first 7 days you can slowly increase you activity.  Do not lift heavy objects for 2 weeks after the surgery.    Try not to strain with bowel movements.  It can be a good idea to take an over-the-counter stool softener after your surgery.  You can take Colace 100 mg twice a day.  Try to drink 60 to 90 ounces of water a day to keep her stools soft.  After the surgery your diet should consist of foods that are easy on your stomach.  Soups, sandwiches, crackers, rice, applesauce, popsicles, mashed potatoes, cooked vegetables or canned fruits are best.  After you have a normal bowel movement you can return to your regular diet.  You should have a bowel movement within 3 days of the surgery.  If you not able to have a bowel movement please contact her office so we can review over-the-counter options to help have a bowel movement.  Walking regularly will help with having a bowel movement.  Please let us know if you have any heavy bleeding from the vagina.  Small to moderate amounts of bleeding similar to a period is  typical.   If you have a bandage covering your incisions he could remove it the day after your surgery.  Your incisions are closed with dissolvable suture and a topical glue.  The topical glue you can peel off gently in the shower 2 weeks after your surgery.  You should shower every day after  the surgery.  You can let soapy water gently run over the incisions to keep them clean.  Please contact us if your incisions appear infected or if you have any drainage of fluid from the incisions.  I would like to know about these symptoms same day since wound infections can get bad fast.      AMBULATORY SURGERY  DISCHARGE INSTRUCTIONS   The drugs that you were given will stay in your system until tomorrow so for the next 24 hours you should not:  Drive an automobile Make any legal decisions Drink any alcoholic beverage   You may resume regular meals tomorrow.  Today it is better to start with liquids and gradually work up to solid foods.  You may eat anything you prefer, but it is better to start with liquids, then soup and crackers, and gradually work up to solid foods.   Please notify your doctor immediately if you have any unusual bleeding, trouble breathing, redness and pain at the surgery site, drainage, fever, or pain not relieved by medication.    Your post-operative visit with Dr.                                       is: Date:  Time:    Please call to schedule your post-operative visit.  Additional Instructions:

## 2021-12-12 LAB — SURGICAL PATHOLOGY

## 2021-12-13 NOTE — Addendum Note (Signed)
Addendum  created 12/13/21 0615 by Doreen Salvage, CRNA   Intraprocedure Meds edited

## 2021-12-16 ENCOUNTER — Other Ambulatory Visit: Payer: Self-pay

## 2021-12-16 ENCOUNTER — Ambulatory Visit (INDEPENDENT_AMBULATORY_CARE_PROVIDER_SITE_OTHER): Payer: Self-pay | Admitting: Obstetrics and Gynecology

## 2021-12-16 ENCOUNTER — Encounter: Payer: Self-pay | Admitting: Obstetrics and Gynecology

## 2021-12-16 VITALS — BP 106/68 | Ht <= 58 in | Wt 81.2 lb

## 2021-12-16 DIAGNOSIS — K5901 Slow transit constipation: Secondary | ICD-10-CM

## 2021-12-16 MED ORDER — BISACODYL 10 MG RE SUPP: 10.0000 mg | Freq: Every day | RECTAL | 0 refills | Status: AC | PRN

## 2021-12-16 MED ORDER — POLYETHYLENE GLYCOL 3350 17 G PO PACK: 17.0000 g | PACK | Freq: Every day | ORAL | 0 refills | Status: AC

## 2021-12-16 NOTE — Patient Instructions (Signed)

## 2021-12-16 NOTE — Progress Notes (Signed)
°  Postoperative Follow-up Patient presents post op from  Laparoscopic bilateral salpingectomy   for  ruptured ectopic pregnancy and desire for sterilization , 1 week ago.  Subjective: She reports that since the surgery she has not been able to have a bowel movement. She is feeling better but still has some left sided pain. No nausea. . Patient reports some improvement in her preop symptoms. Eating a regular diet without difficulty.  Pain is controlled with current analgesics. Medications being used: narcotic analgesics including tramadol.   Activity: normal activities of daily living.   Objective: BP 106/68    Ht 4\' 9"  (1.448 m)    Wt 81 lb 3.2 oz (36.8 kg)    BMI 17.57 kg/m  Physical Exam Constitutional:      Appearance: Normal appearance. She is well-developed.  HENT:     Head: Normocephalic and atraumatic.  Eyes:     Extraocular Movements: Extraocular movements intact.     Pupils: Pupils are equal, round, and reactive to light.  Neck:     Thyroid: No thyromegaly.  Cardiovascular:     Rate and Rhythm: Normal rate and regular rhythm.     Heart sounds: Normal heart sounds.  Pulmonary:     Effort: Pulmonary effort is normal.     Breath sounds: Normal breath sounds.  Abdominal:     General: Bowel sounds are normal. There is no distension.     Palpations: Abdomen is soft. There is no mass.     Comments: Incisions are clean, dry and intact. Healing well.  Musculoskeletal:     Cervical back: Neck supple.  Neurological:     Mental Status: She is alert and oriented to person, place, and time.  Skin:    General: Skin is warm and dry.  Psychiatric:        Behavior: Behavior normal.        Thought Content: Thought content normal.        Judgment: Judgment normal.  Vitals reviewed.    Assessment: s/p :  Laparoscopic bilateral salpingectomy stable  Plan:  Patient has done well after surgery with no apparent complications.   Constipation- recommended Miralax and dulcolax  suppository. If this does not work she can use an OTC enema. Follow up if no improvement in constipation  Declined pap smear today.   I have discussed the post-operative course to date, and the expected progress moving forward.  The patient understands what complications to be concerned about.  I will see the patient in routine follow up, or sooner if needed.    Activity plan: No heavy lifting.     Adrian Prows MD, [place] 12/16/2021 4:11 PM    Declined pap smear

## 2024-06-13 ENCOUNTER — Emergency Department

## 2024-06-13 ENCOUNTER — Encounter: Payer: Self-pay | Admitting: Intensive Care

## 2024-06-13 ENCOUNTER — Other Ambulatory Visit: Payer: Self-pay

## 2024-06-13 ENCOUNTER — Emergency Department: Admission: EM | Admit: 2024-06-13 | Discharge: 2024-06-13 | Disposition: A | Discharge status: Home / Self Care

## 2024-06-13 DIAGNOSIS — R1110 Vomiting, unspecified: Secondary | ICD-10-CM | POA: Insufficient documentation

## 2024-06-13 DIAGNOSIS — K5900 Constipation, unspecified: Secondary | ICD-10-CM | POA: Diagnosis not present

## 2024-06-13 DIAGNOSIS — N83202 Unspecified ovarian cyst, left side: Secondary | ICD-10-CM | POA: Diagnosis not present

## 2024-06-13 DIAGNOSIS — R1030 Lower abdominal pain, unspecified: Secondary | ICD-10-CM | POA: Diagnosis present

## 2024-06-13 LAB — CBC
HCT: 39.5 % (ref 36.0–46.0)
HCT: 38.8 % (ref 36.0–46.0)
Hemoglobin: 13.8 g/dL (ref 12.0–15.0)
Hemoglobin: 13.6 g/dL (ref 12.0–15.0)
MCH: 34.1 pg — ABNORMAL HIGH (ref 26.0–34.0)
MCH: 33.9 pg (ref 26.0–34.0)
MCHC: 34.9 g/dL (ref 30.0–36.0)
MCHC: 35.1 g/dL (ref 30.0–36.0)
MCV: 97.5 fL (ref 80.0–100.0)
MCV: 96.8 fL (ref 80.0–100.0)
Platelets: 215 K/uL (ref 150–400)
Platelets: 223 K/uL (ref 150–400)
RBC: 4.05 MIL/uL (ref 3.87–5.11)
RBC: 4.01 MIL/uL (ref 3.87–5.11)
RDW: 12 % (ref 11.5–15.5)
RDW: 11.9 % (ref 11.5–15.5)
WBC: 8.7 K/uL (ref 4.0–10.5)
WBC: 10.9 K/uL — ABNORMAL HIGH (ref 4.0–10.5)
nRBC: 0 % (ref 0.0–0.2)
nRBC: 0 % (ref 0.0–0.2)

## 2024-06-13 LAB — URINALYSIS, ROUTINE W REFLEX MICROSCOPIC
Bilirubin Urine: NEGATIVE
Glucose, UA: NEGATIVE mg/dL
Hgb urine dipstick: NEGATIVE
Ketones, ur: NEGATIVE mg/dL
Leukocytes,Ua: NEGATIVE
Nitrite: NEGATIVE
Protein, ur: 30 mg/dL — AB
Specific Gravity, Urine: 1.029 (ref 1.005–1.030)
pH: 5 (ref 5.0–8.0)

## 2024-06-13 LAB — COMPREHENSIVE METABOLIC PANEL WITH GFR
ALT: 13 U/L (ref 0–44)
AST: 17 U/L (ref 15–41)
Albumin: 3.8 g/dL (ref 3.5–5.0)
Alkaline Phosphatase: 54 U/L (ref 38–126)
Anion gap: 12 (ref 5–15)
BUN: 10 mg/dL (ref 6–20)
CO2: 22 mmol/L (ref 22–32)
Calcium: 9.2 mg/dL (ref 8.9–10.3)
Chloride: 106 mmol/L (ref 98–111)
Creatinine, Ser: 0.78 mg/dL (ref 0.44–1.00)
GFR, Estimated: >60 mL/min (ref >60)
Glucose, Bld: 126 mg/dL — ABNORMAL HIGH (ref 70–99)
Potassium: 4 mmol/L (ref 3.5–5.1)
Sodium: 140 mmol/L (ref 135–145)
Total Bilirubin: 0.8 mg/dL (ref 0.0–1.2)
Total Protein: 7.3 g/dL (ref 6.5–8.1)

## 2024-06-13 LAB — LIPASE, BLOOD: Lipase: 29 U/L (ref 11–51)

## 2024-06-13 LAB — HCG, QUANTITATIVE, PREGNANCY: hCG, Beta Chain, Quant, S: <1 m[IU]/mL (ref <5)

## 2024-06-13 MED ORDER — POLYETHYLENE GLYCOL 3350 17 G PO PACK: 17.0000 g | PACK | Freq: Every day | ORAL | 0 refills | Status: AC

## 2024-06-13 MED ORDER — MORPHINE SULFATE (PF) 2 MG/ML IV SOLN
2.0000 mg | Freq: Once | INTRAVENOUS | Status: AC
  Administered 2024-06-13: 2 mg via INTRAVENOUS
  Filled 2024-06-13: qty 1

## 2024-06-13 MED ORDER — KETOROLAC TROMETHAMINE 15 MG/ML IJ SOLN
15.0000 mg | Freq: Once | INTRAMUSCULAR | Status: AC
  Administered 2024-06-13: 15 mg via INTRAVENOUS
  Filled 2024-06-13: qty 1

## 2024-06-13 MED ORDER — IOHEXOL 300 MG/ML  SOLN
80.0000 mL | Freq: Once | INTRAMUSCULAR | Status: AC | PRN
  Administered 2024-06-13: 80 mL via INTRAVENOUS

## 2024-06-13 MED ORDER — GLYCERIN (ADULT) 2 G RE SUPP: 1.0000 | RECTAL | 0 refills | Status: AC | PRN

## 2024-06-13 MED ORDER — MORPHINE SULFATE (PF) 4 MG/ML IV SOLN
4.0000 mg | Freq: Once | INTRAVENOUS | Status: AC
  Administered 2024-06-13: 4 mg via INTRAVENOUS
  Filled 2024-06-13: qty 1

## 2024-06-13 MED ORDER — IBUPROFEN 200 MG PO TABS: 600.0000 mg | ORAL_TABLET | Freq: Three times a day (TID) | ORAL | 2 refills | Status: AC | PRN

## 2024-06-13 MED ORDER — ACETAMINOPHEN 500 MG PO TABS: 1000.0000 mg | ORAL_TABLET | Freq: Four times a day (QID) | ORAL | 2 refills | Status: AC | PRN

## 2024-06-13 MED ORDER — ACETAMINOPHEN 500 MG PO TABS
1000.0000 mg | ORAL_TABLET | Freq: Once | ORAL | Status: AC
  Administered 2024-06-13: 1000 mg via ORAL
  Filled 2024-06-13: qty 2

## 2024-06-13 NOTE — ED Notes (Signed)
 Pt reports constipation x2 weeks with only being able to pass small pebble like stool. Pt did just have a bowel movement while in the ED and describes it as somewhat normal.

## 2024-06-13 NOTE — ED Triage Notes (Signed)
 Patient c/o abdominal pain with nausea. Pain worsened today and noticed smeared blood on toilet paper after trying to have BM. Feels constipated   Reports hard pebble BM's this past month

## 2024-06-13 NOTE — Discharge Instructions (Addendum)
 Your evaluation in the emergency department was overall reassuring.  As discussed, we did see evidence of a hemorrhagic ovarian cyst, and I prescribed you pain medications to help with this.  Please do follow-up closely with your OB/GYN provider for reevaluation of this.  With regard to your constipation, I prescribed you a laxative as well as a suppository medication to use over the next several days.  Please follow-up with your primary care provider regarding this.  Return to the emergency department with any new or worsening symptoms.

## 2024-06-13 NOTE — ED Notes (Signed)
 Assisted Dr. Clarine with rectal exam

## 2024-06-13 NOTE — ED Provider Notes (Signed)
 [facility] Provider Note    Event Date/Time   First MD Initiated Contact with Patient 06/13/24 1503     (approximate)   History   Abdominal Pain  Patient c/o abdominal pain with nausea. Pain worsened today and noticed smeared blood on toilet paper after trying to have BM. Feels constipated   Reports hard pebble BM's this past month   HPI Katherine Bentley is a 38 y.o. female PMH constipation, prior left pregnancy with rupture, prior C-section presents for evaluation of abdominal pain, constipation - Patient states she has had severe lower abdominal pain throughout all of today.  Took a antacid with minimal relief.  Pain is severe, 10/10, diffuse though worse in the suprapubic region.  No vaginal bleeding or discharge.  Has not had bowel movement for several days though did have a bowel movement shortly after arrival to emergency department.  No frank blood, no melena in stool though did have small amount of blood streaks while wiping. -No p.o. intake today -LMP 05/20/2024     Physical Exam   Triage Vital Signs: ED Triage Vitals  Encounter Vitals Group     BP 06/13/24 1349 (!) 155/82     Girls Systolic BP Percentile --      Girls Diastolic BP Percentile --      Boys Systolic BP Percentile --      Boys Diastolic BP Percentile --      Pulse Rate 06/13/24 1349 61     Resp 06/13/24 1349 16     Temp 06/13/24 1349 98.2 F (36.8 C)     Temp Source 06/13/24 1349 Oral     SpO2 06/13/24 1349 98 %     Weight 06/13/24 1350 98 lb (44.5 kg)     Height 06/13/24 1350 4' 9 (1.448 m)     Head Circumference --      Peak Flow --      Pain Score 06/13/24 1350 6     Pain Loc --      Pain Education --      Exclude from Growth Chart --     Most recent vital signs: Vitals:   06/13/24 1510 06/13/24 1900  BP: (!) 142/81 122/81  Pulse: (!) 57 (!) 58  Resp: 18 18  Temp:  98.3 F (36.8 C)  SpO2: 100% 100%     General: Awake, appears uncomfortable but  nontoxic CV:  Good peripheral perfusion. RRR, RP 2+ Resp:  Normal effort. CTAB Abd:  No distention.  Moderate tenderness to palpation throughout, appears most uncomfortable throughout lower abdomen, no frank CVA tenderness Rectal:  See ED course below     ED Results / Procedures / Treatments   Labs (all labs ordered are listed, but only abnormal results are displayed) Labs Reviewed  COMPREHENSIVE METABOLIC PANEL WITH GFR - Abnormal; Notable for the following components:      Result Value   Glucose, Bld 126 (*)    All other components within normal limits  CBC - Abnormal; Notable for the following components:   MCH 34.1 (*)    All other components within normal limits  URINALYSIS, ROUTINE W REFLEX MICROSCOPIC - Abnormal; Notable for the following components:   Color, Urine AMBER (*)    APPearance CLOUDY (*)    Protein, ur 30 (*)    Bacteria, UA FEW (*)    All other components within normal limits  CBC - Abnormal; Notable for the following components:   WBC 10.9 (*)  All other components within normal limits  LIPASE, BLOOD  HCG, QUANTITATIVE, PREGNANCY     EKG  N/a   RADIOLOGY Radiology interpreted by myself and radiology report reviewed.  Concerning for possible left hemorrhagic ovarian cyst, also evidence of fecal impaction.    PROCEDURES:  Critical Care performed: No  .Fecal disimpaction  Date/Time: 06/13/2024 7:06 PM  Performed by: Clarine Ozell LABOR, MD Authorized by: Clarine Ozell LABOR, MD  Consent: Verbal consent obtained. Written consent not obtained Risks and benefits: risks, benefits and alternatives were discussed Consent given by: patient Patient understanding: patient states understanding of the procedure being performed Test results: test results available and properly labeled Patient identity confirmed: verbally with patient and arm band Local anesthesia used: no  Anesthesia: Local anesthesia used: no  Sedation: Patient sedated: no  Patient  tolerance: patient tolerated the procedure well with no immediate complications Comments: Mild amount of light brown stool disimpacted      MEDICATIONS ORDERED IN ED: Medications  morphine  (PF) 2 MG/ML injection 2 mg (2 mg Intravenous Given 06/13/24 1602)  acetaminophen  (TYLENOL ) tablet 1,000 mg (1,000 mg Oral Given 06/13/24 1556)  ketorolac  (TORADOL ) 15 MG/ML injection 15 mg (15 mg Intravenous Given 06/13/24 1602)  iohexol  (OMNIPAQUE ) 300 MG/ML solution 80 mL (80 mLs Intravenous Contrast Given 06/13/24 1725)  morphine  (PF) 4 MG/ML injection 4 mg (4 mg Intravenous Given 06/13/24 1810)     IMPRESSION / MDM / ASSESSMENT AND PLAN / ED COURSE  I reviewed the triage vital signs and the nursing notes.                              DDX/MDM/AP: Differential diagnosis includes, but is not limited to, diverticulitis, pain secondary constipation, appendicitis, UTI/pyelonephritis, urolithiasis, less likely pelvic pathology given no associated bleeding or discharge will consider if workup otherwise unremarkable.  In regard to streaking of blood, suspect mild hemorrhoids, will perform chaperoned rectal exam shortly.  Plan: - Labs - N.p.o. - CT abdomen pelvis - Rectal exam - Pain control with-reassess  Patient's presentation is most consistent with acute presentation with potential threat to life or bodily function.  ED course below.  Laboratory workup unremarkable.  CT abdomen pelvis showing evidence of mild ongoing stool impaction and some surrounding rectal edema.  Also showing 2.9 cm left ovarian cyst with possible hemorrhage.  Patient very well-appearing on reeval with notably improved exam, do not suspect torsion at this time, cyst less than 5 cm.  Repeat CBC stable, no concern for significant blood loss secondary to this.  Discharged home with Tylenol  and Motrin  with plan for outpatient OB/GYN follow-up for reevaluation of this.  Also discharged with MiraLAX  and glycerin  suppositories, declined enema  here, notes significant stool burden on rectal exam, mild amount of stool disimpacted, no blood appreciated.  Plan for PMD follow-up.  ED return precautions in place.  Patient agrees with plan.  Clinical Course as of 06/13/24 1908  Mon Jun 13, 2024  1554 HCG, Beta Chain, Quant, S: <1 [MM]  1554 CBC, CMP, lipase reviewed, unremarkable [MM]  1659 Urinalysis reviewed, contaminated, no clear evidence of infection [MM]  1754 CTAP: IMPRESSION: 1. Mild perirectal/presacral edema with suspected mild fecal impaction. 2. 2.9 cm cystic right adnexal lesion with internal high attenuation, findings which suggest a hemorrhagic ovarian follicle or cyst. 3. No additional acute findings.   [MM]  1851 Rectal exam with minimal stool in rectal vault, small amount of brown stool  disimpacted, no blood, no masses  Offered enema, patient declined.  Will plan for MiraLAX  and glycerin  suppositories at home.  Patient confirms she had a large firm bowel movement shortly after arrival to emergency department. [MM]    Clinical Course User Index [MM] Clarine Ozell LABOR, MD     FINAL CLINICAL IMPRESSION(S) / ED DIAGNOSES   Final diagnoses:  Hemorrhagic cyst of left ovary  Constipation, unspecified constipation type     Rx / DC Orders   ED Discharge Orders          Ordered    polyethylene glycol (MIRALAX ) 17 g packet  Daily        06/13/24 1857    glycerin  adult 2 g suppository  As needed        06/13/24 1857    acetaminophen  (TYLENOL ) 500 MG tablet  Every 6 hours PRN        06/13/24 1857    ibuprofen  (MOTRIN  IB) 200 MG tablet  Every 8 hours PRN        06/13/24 1857             Note:  This document was prepared using Dragon voice recognition software and may include unintentional dictation errors.   Clarine Ozell LABOR, MD 06/13/24 TYRA

## 2024-06-20 NOTE — Progress Notes (Unsigned)
    GYNECOLOGY PROGRESS NOTE  Subjective:  PCP: Patient, No Pcp Per  Patient ID: Katherine Bentley, female    DOB: 1986-08-08, 37 y.o.   MRN: [id number]  HPI  Patient is a 38 y.o. [id number] female who presents for ER follow up from 06/13/24 for a right adnexal mass seen on CT. She presented for pelvic pain, hx of ectopic and nervous she may have another. hCG was negative, but mass was 2.9cm, favoring right ovarian cyst with possible hemorrhage. She continues to have right sided pelvic pain.  She is s/p BTL in 2016 and in Feb 2023 had a L ectopic, s/p bilat salpingectomy.   The following portions of the patient's history were reviewed and updated as appropriate: allergies, current medications, past family history, past medical history, past social history, past surgical history, and problem list.  Review of Systems Pertinent items are noted in HPI.   Objective:   Blood pressure 117/63, pulse 61, height 4' 9 (1.448 m), weight 83 lb 3.2 oz (37.7 kg), last menstrual period 06/17/2024. Body mass index is 18 kg/m.  General appearance: alert and cooperative Abdomen: abnormal findings:  mild tenderness in the LLQ Pelvic: cervix normal in appearance, external genitalia normal, no adnexal masses or tenderness, no cervical motion tenderness, positive findings: tenderness in L adnexa, no mass appreciated, rectovaginal septum normal, uterus normal size, shape, and consistency, and vagina normal without discharge; pap obtained.  Extremities: extremities normal, atraumatic, no cyanosis or edema Neurologic: Grossly normal  06/13/24 CT ABDOMEN AND PELVIS WITH CONTRAST    Stomach/Bowel: Stomach, small bowel, appendix and colon are unremarkable. Moderate amount of stool in the rectum with very mild perirectal/presacral edema.   Reproductive: Uterus is visualized. 2.9 cm right adnexal cyst with internal high attenuation (2/51).   Other: No free fluid.   Musculoskeletal: None.   IMPRESSION: 1. Mild  perirectal/presacral edema with suspected mild fecal impaction. 2. 2.9 cm cystic right adnexal lesion with internal high attenuation, findings which suggest a hemorrhagic ovarian follicle or cyst. 3. No additional acute findings.  Assessment/Plan:   1. Simple adnexal cyst less than 5 cm in diameter in premenopausal patient   2. Pelvic pain   3. Cervical cancer screening    Katherine Bentley is a 38 y.o. [id number]  who is s/p BTL in 2016, but developed a L ectopic in 2023 and is s/p bilateral salpingectomy.  Now with a 2.9cm likely hemorrhagic cyst on her R ovary seen on CT in ED, done for pelvic pain. Pt main concern was a recurrent ectopic, discussed that was ruled out in the ED, hCG was negative. Symptoms confounded by constipation seen on CT as well, encouraged pt to continue daily Miralax . Ordering pelvic US , will call with results; If hemorrhagic cyst, discussed expectant management, symptom control at home, and depending on findings, may need 3-6 mos US  eval for resolution. Pap due, was collected during pelvic exam today.   Total time was 30 minutes. That includes chart review before the visit, the actual patient visit, and time spent on documentation after the visit. Time excludes procedures, if any.    Estil Mangle, DO [facility]

## 2024-06-28 ENCOUNTER — Other Ambulatory Visit (HOSPITAL_COMMUNITY)
Admission: RE | Admit: 2024-06-28 | Discharge: 2024-06-28 | Disposition: A | Discharge status: Home / Self Care | Source: Ambulatory Visit | Attending: Obstetrics | Admitting: Obstetrics

## 2024-06-28 ENCOUNTER — Ambulatory Visit (INDEPENDENT_AMBULATORY_CARE_PROVIDER_SITE_OTHER): Admitting: Obstetrics

## 2024-06-28 ENCOUNTER — Encounter: Payer: Self-pay | Admitting: Obstetrics

## 2024-06-28 VITALS — BP 117/63 | HR 61 | Ht <= 58 in | Wt 83.2 lb

## 2024-06-28 DIAGNOSIS — R102 Pelvic and perineal pain: Secondary | ICD-10-CM | POA: Diagnosis not present

## 2024-06-28 DIAGNOSIS — N83201 Unspecified ovarian cyst, right side: Secondary | ICD-10-CM

## 2024-06-28 DIAGNOSIS — N958 Other specified menopausal and perimenopausal disorders: Secondary | ICD-10-CM | POA: Diagnosis not present

## 2024-06-28 DIAGNOSIS — Z124 Encounter for screening for malignant neoplasm of cervix: Secondary | ICD-10-CM

## 2024-06-28 DIAGNOSIS — N9489 Other specified conditions associated with female genital organs and menstrual cycle: Secondary | ICD-10-CM

## 2024-07-01 LAB — CYTOLOGY - PAP
Comment: NEGATIVE
Diagnosis: NEGATIVE
High risk HPV: NEGATIVE

## 2024-07-04 ENCOUNTER — Ambulatory Visit: Payer: Self-pay | Admitting: Obstetrics

## 2024-07-20 NOTE — Progress Notes (Signed)
    GYNECOLOGY PROGRESS NOTE  Subjective:  PCP: Patient, No Pcp Per  Patient ID: Katherine Bentley, female    DOB: [birth year], 39 y.o.   MRN: [id number]  HPI  Patient is a 38 y.o. [id number], s/p BTL in 2016, then bilat salpingectomy in 2023 after a L ectopic, here for US  follow-up, obtained for a 2.9cm hemorrhagic cyst on R ovary seen on CT in ED.  She was seen by me on 06/28/24 and ordered pelvic US , which was done earlier today. She continues to have intermittent L-sided throbbing that self-resolves.   The following portions of the patient's history were reviewed and updated as appropriate: allergies, current medications, past family history, past medical history, past social history, past surgical history, and problem list.  Review of Systems Pertinent items are noted in HPI.   Objective:   Blood pressure 105/69, pulse 68, weight 88 lb (39.9 kg), last menstrual period 07/19/2024. Body mass index is 19.04 kg/m.  General appearance: alert and cooperative Abdomen: soft, non-tender; bowel sounds normal; no masses,  no organomegaly Pelvic: deferred Extremities: extremities normal, atraumatic, no cyanosis or edema Neurologic: Grossly normal  07/27/24 US  PELVIC  Indications: Rt Hemorrhagic cyst was noted on previous CT scan. Findings:  The uterus is anteverted and measures 8.9 x 4.5 x 4.7 cm with a uterine volume of 100.23 ml. Echo texture is homogenous without evidence of focal masses.   The Endometrium measures 8.2 mm.   Right Ovary measures 3.2 x 2.0 x 2.5 cm. It is normal in appearance. Left Ovary measures 2.9 x 1.9 x 1.7 cm. It is normal in appearance. Survey of the adnexa demonstrates no adnexal masses. There is no free fluid in the cul de sac.   Impression: 1. No evidence of any hemorrhagic cyst was noted on either ovary.  Assessment/Plan:   1. Left lower quadrant abdominal pain   2. Simple adnexal cyst less than 5 cm in diameter in premenopausal patient     38 y.o.  [id number] with 2.9cm hemorrhagic cyst on CT 06/13/24, now resolved on US  today. Reviewed findings and reassurance given that cyst is resolved. As for her LLQ pain, unsure and US  without findings. CT from 8/4 did mention constipation, again emphasized bowels may be cramping and can use Miralax . Encouraged pt to establish with a PCP to investigate further, continue symptomatic management at home.   Follow up prn.    Estil Mangle, DO [facility]

## 2024-07-27 ENCOUNTER — Ambulatory Visit: Admitting: Obstetrics

## 2024-07-27 ENCOUNTER — Encounter: Payer: Self-pay | Admitting: Obstetrics

## 2024-07-27 ENCOUNTER — Ambulatory Visit (INDEPENDENT_AMBULATORY_CARE_PROVIDER_SITE_OTHER)

## 2024-07-27 VITALS — BP 105/69 | HR 68 | Wt 88.0 lb

## 2024-07-27 DIAGNOSIS — N83202 Unspecified ovarian cyst, left side: Secondary | ICD-10-CM | POA: Diagnosis not present

## 2024-07-27 DIAGNOSIS — N9489 Other specified conditions associated with female genital organs and menstrual cycle: Secondary | ICD-10-CM

## 2024-07-27 DIAGNOSIS — R102 Pelvic and perineal pain: Secondary | ICD-10-CM | POA: Diagnosis not present

## 2024-07-27 DIAGNOSIS — R1032 Left lower quadrant pain: Secondary | ICD-10-CM
